# Patient Record
Sex: Female | Born: 1970
Health system: Southern US, Community
[De-identification: ages and names within clinical notes are randomized; demographics above are authoritative.]

## PROBLEM LIST (undated history)

## (undated) DIAGNOSIS — E559 Vitamin D deficiency, unspecified: Secondary | ICD-10-CM

## (undated) DIAGNOSIS — R112 Nausea with vomiting, unspecified: Secondary | ICD-10-CM

## (undated) DIAGNOSIS — R0602 Shortness of breath: Secondary | ICD-10-CM

## (undated) DIAGNOSIS — R7303 Prediabetes: Secondary | ICD-10-CM

## (undated) DIAGNOSIS — Z9889 Other specified postprocedural states: Secondary | ICD-10-CM

## (undated) DIAGNOSIS — I10 Essential (primary) hypertension: Secondary | ICD-10-CM

## (undated) HISTORY — DX: Prediabetes: R73.03

## (undated) HISTORY — DX: Vitamin D deficiency, unspecified: E55.9

## (undated) HISTORY — DX: Shortness of breath: R06.02

---

## 1998-05-02 ENCOUNTER — Ambulatory Visit (HOSPITAL_COMMUNITY): Admission: RE | Admit: 1998-05-02 | Discharge: 1998-05-02 | Payer: Self-pay | Admitting: Family Medicine

## 2001-04-28 ENCOUNTER — Ambulatory Visit (HOSPITAL_COMMUNITY): Admission: RE | Admit: 2001-04-28 | Discharge: 2001-04-28 | Payer: Self-pay | Admitting: Family Medicine

## 2001-04-28 ENCOUNTER — Encounter: Payer: Self-pay | Admitting: Family Medicine

## 2002-05-03 ENCOUNTER — Encounter: Payer: Self-pay | Admitting: Obstetrics and Gynecology

## 2002-05-03 ENCOUNTER — Ambulatory Visit (HOSPITAL_COMMUNITY): Admission: RE | Admit: 2002-05-03 | Discharge: 2002-05-03 | Payer: Self-pay | Admitting: Obstetrics and Gynecology

## 2002-08-04 ENCOUNTER — Inpatient Hospital Stay (HOSPITAL_COMMUNITY): Admission: AD | Admit: 2002-08-04 | Discharge: 2002-08-04 | Payer: Self-pay | Admitting: Obstetrics and Gynecology

## 2002-08-13 ENCOUNTER — Inpatient Hospital Stay (HOSPITAL_COMMUNITY): Admission: AD | Admit: 2002-08-13 | Discharge: 2002-08-13 | Payer: Self-pay | Admitting: Obstetrics and Gynecology

## 2002-09-03 ENCOUNTER — Inpatient Hospital Stay (HOSPITAL_COMMUNITY): Admission: AD | Admit: 2002-09-03 | Discharge: 2002-09-03 | Payer: Self-pay | Admitting: Obstetrics and Gynecology

## 2002-09-27 ENCOUNTER — Inpatient Hospital Stay (HOSPITAL_COMMUNITY): Admission: AD | Admit: 2002-09-27 | Discharge: 2002-09-29 | Payer: Self-pay | Admitting: Obstetrics and Gynecology

## 2002-11-08 ENCOUNTER — Other Ambulatory Visit: Admission: RE | Admit: 2002-11-08 | Discharge: 2002-11-08 | Payer: Self-pay | Admitting: Obstetrics and Gynecology

## 2003-12-21 ENCOUNTER — Other Ambulatory Visit: Admission: RE | Admit: 2003-12-21 | Discharge: 2003-12-21 | Payer: Self-pay | Admitting: Obstetrics and Gynecology

## 2005-01-21 ENCOUNTER — Other Ambulatory Visit: Admission: RE | Admit: 2005-01-21 | Discharge: 2005-01-21 | Payer: Self-pay | Admitting: Obstetrics and Gynecology

## 2006-06-11 ENCOUNTER — Other Ambulatory Visit: Admission: RE | Admit: 2006-06-11 | Discharge: 2006-06-11 | Payer: Self-pay | Admitting: Obstetrics and Gynecology

## 2006-07-28 ENCOUNTER — Encounter: Admission: RE | Admit: 2006-07-28 | Discharge: 2006-07-28 | Payer: Self-pay | Admitting: Obstetrics and Gynecology

## 2008-09-30 HISTORY — PX: BREAST SURGERY: SHX581

## 2009-01-16 ENCOUNTER — Other Ambulatory Visit: Admission: RE | Admit: 2009-01-16 | Discharge: 2009-01-16 | Payer: Self-pay | Admitting: Family Medicine

## 2009-09-12 ENCOUNTER — Ambulatory Visit (HOSPITAL_BASED_OUTPATIENT_CLINIC_OR_DEPARTMENT_OTHER): Admission: RE | Admit: 2009-09-12 | Discharge: 2009-09-12 | Payer: Self-pay | Admitting: Plastic Surgery

## 2010-09-30 HISTORY — PX: BREAST SURGERY: SHX581

## 2010-11-15 ENCOUNTER — Other Ambulatory Visit: Payer: Self-pay | Admitting: Dermatology

## 2011-01-01 LAB — POCT HEMOGLOBIN-HEMACUE: Hemoglobin: 13.1 g/dL (ref 12.0–15.0)

## 2011-02-15 NOTE — Discharge Summary (Signed)
Melinda Hines, Melinda Hines                        ACCOUNT NO.:  [id number]   MEDICAL RECORD NO.:  [id number]                   PATIENT TYPE:  INP   LOCATION:  9138                                 FACILITY:  [facility]   PHYSICIAN:  Huel Cote, M.D.              DATE OF BIRTH:  1970/11/19   DATE OF ADMISSION:  09/27/2002  DATE OF DISCHARGE:  09/29/2002                                 DISCHARGE SUMMARY   DISCHARGE DIAGNOSES:  1. Term pregnancy at 39 weeks, delivered.  2. Preterm contractions with this pregnancy.  3. Status post normal spontaneous vaginal delivery.   DISCHARGE MEDICATIONS:  1. Motrin 600 mg p.o. q.6h.  2. Percocet one to two tablets p.o. q.4h. p.r.n.   DISCHARGE FOLLOWUP:  The patient is to follow up in six weeks for her  routine postpartum examination.   HOSPITAL COURSE:  The patient is a 40 year old G3, P0-0-2-0 who is admitted  at [redacted] weeks gestation for induction; however, came in complaining of regular  contractions prior to that appointment.  She had some possible leakage of  fluid on admission and her cervix was 2 cm dilated with contractions every  five to seven minutes.   PRENATAL LABORATORIES:  A+.  Antibody negative.  RPR nonreactive.  Rubella  immune.  Hepatitis B surface antigen negative.  HIV negative.  GC negative.  Chlamydia negative.  Triple screen normal.  Glucola 90.  GBS negative.  Prenatal care had been complicated by preterm contractions at 32 weeks with  a positive fetal fibronectin.   PAST OB HISTORY:  Elective abortion x2.   PAST MEDICAL HISTORY:  None.   PAST SURGICAL HISTORY:  None.   ALLERGIES:  She had no known drug allergies.   HOSPITAL COURSE:  On admission she was afebrile with stable vital signs.  Fetal heart rate is reassuring with contractions every three to five  minutes.  She was gravid, nontender with an estimated fetal weight of 7.5  pounds.  The patient progressed after receiving an epidural to complete  dilation and  rested briefly before beginning pushing giving decreased  sensation.  The patient pushed approximately one hour with a normal  spontaneous vaginal delivery of a viable female infant over a first degree  laceration.  Apgars were 5, 8, and 9.  Weight was 7 pounds 2 ounces.  Baby  initially was responding to suction and became floppy after the cord was  cut.  Pediatricians were called to evaluate.  The baby responded well to  oxygen, suctioning, and stimulus.  The baby did have a mild temperature  shortly after delivery which was observed closely.  First degree laceration  was repaired with 2-0 Vicryl and the baby  was felt stable to go to the normal nursery after delivery.  On postpartum  day number two the patient and baby were doing very well.  There were no  significant complaints.  Fundus was firm.  She was afebrile with stable  vital signs and therefore was felt stable for discharge home.                                               Huel Cote, M.D.    KR/MEDQ  D:  11/23/2002  T:  11/23/2002  Job:  [id number]

## 2011-03-20 ENCOUNTER — Other Ambulatory Visit: Payer: Self-pay | Admitting: Obstetrics and Gynecology

## 2011-03-20 DIAGNOSIS — Z1231 Encounter for screening mammogram for malignant neoplasm of breast: Secondary | ICD-10-CM

## 2011-04-01 ENCOUNTER — Ambulatory Visit
Admission: RE | Admit: 2011-04-01 | Discharge: 2011-04-01 | Disposition: A | Discharge status: Home / Self Care | Payer: Self-pay | Source: Ambulatory Visit | Attending: Obstetrics and Gynecology | Admitting: Obstetrics and Gynecology

## 2011-04-01 DIAGNOSIS — Z1231 Encounter for screening mammogram for malignant neoplasm of breast: Secondary | ICD-10-CM

## 2011-04-08 ENCOUNTER — Other Ambulatory Visit: Payer: Self-pay | Admitting: Obstetrics and Gynecology

## 2011-04-08 DIAGNOSIS — R928 Other abnormal and inconclusive findings on diagnostic imaging of breast: Secondary | ICD-10-CM

## 2011-04-10 ENCOUNTER — Ambulatory Visit
Admission: RE | Admit: 2011-04-10 | Discharge: 2011-04-10 | Disposition: A | Discharge status: Home / Self Care | Payer: Self-pay | Source: Ambulatory Visit | Attending: Obstetrics and Gynecology | Admitting: Obstetrics and Gynecology

## 2011-04-10 DIAGNOSIS — R928 Other abnormal and inconclusive findings on diagnostic imaging of breast: Secondary | ICD-10-CM

## 2011-04-26 ENCOUNTER — Other Ambulatory Visit: Payer: Self-pay | Admitting: Obstetrics and Gynecology

## 2011-04-26 DIAGNOSIS — Z1231 Encounter for screening mammogram for malignant neoplasm of breast: Secondary | ICD-10-CM

## 2012-04-10 ENCOUNTER — Ambulatory Visit
Admission: RE | Admit: 2012-04-10 | Discharge: 2012-04-10 | Disposition: A | Discharge status: Home / Self Care | Payer: PRIVATE HEALTH INSURANCE | Source: Ambulatory Visit | Attending: Obstetrics and Gynecology | Admitting: Obstetrics and Gynecology

## 2012-04-10 DIAGNOSIS — Z1231 Encounter for screening mammogram for malignant neoplasm of breast: Secondary | ICD-10-CM

## 2013-01-20 ENCOUNTER — Other Ambulatory Visit: Payer: Self-pay | Admitting: Dermatology

## 2013-03-01 ENCOUNTER — Other Ambulatory Visit: Payer: Self-pay

## 2013-03-01 DIAGNOSIS — Z1231 Encounter for screening mammogram for malignant neoplasm of breast: Secondary | ICD-10-CM

## 2013-04-12 ENCOUNTER — Ambulatory Visit
Admission: RE | Admit: 2013-04-12 | Discharge: 2013-04-12 | Disposition: A | Discharge status: Home / Self Care | Payer: PRIVATE HEALTH INSURANCE | Source: Ambulatory Visit

## 2013-04-12 DIAGNOSIS — Z1231 Encounter for screening mammogram for malignant neoplasm of breast: Secondary | ICD-10-CM

## 2013-04-29 ENCOUNTER — Other Ambulatory Visit: Payer: Self-pay | Admitting: Dermatology

## 2014-03-09 ENCOUNTER — Other Ambulatory Visit: Payer: Self-pay

## 2014-03-09 DIAGNOSIS — Z1231 Encounter for screening mammogram for malignant neoplasm of breast: Secondary | ICD-10-CM

## 2014-04-11 ENCOUNTER — Other Ambulatory Visit: Payer: Self-pay | Admitting: Family Medicine

## 2014-04-11 ENCOUNTER — Ambulatory Visit
Admission: RE | Admit: 2014-04-11 | Discharge: 2014-04-11 | Disposition: A | Discharge status: Home / Self Care | Payer: 59 | Source: Ambulatory Visit | Attending: Family Medicine | Admitting: Family Medicine

## 2014-04-11 DIAGNOSIS — M79644 Pain in right finger(s): Secondary | ICD-10-CM

## 2014-04-11 DIAGNOSIS — M7989 Other specified soft tissue disorders: Secondary | ICD-10-CM

## 2014-04-13 ENCOUNTER — Ambulatory Visit
Admission: RE | Admit: 2014-04-13 | Discharge: 2014-04-13 | Disposition: A | Discharge status: Home / Self Care | Payer: 59 | Source: Ambulatory Visit

## 2014-04-13 DIAGNOSIS — Z1231 Encounter for screening mammogram for malignant neoplasm of breast: Secondary | ICD-10-CM

## 2014-06-13 ENCOUNTER — Emergency Department (HOSPITAL_COMMUNITY)
Admission: EM | Admit: 2014-06-13 | Discharge: 2014-06-13 | Disposition: A | Discharge status: Home / Self Care | Payer: 59 | Source: Home / Self Care | Attending: Emergency Medicine | Admitting: Emergency Medicine

## 2014-06-13 ENCOUNTER — Emergency Department (INDEPENDENT_AMBULATORY_CARE_PROVIDER_SITE_OTHER): Payer: 59

## 2014-06-13 ENCOUNTER — Encounter (HOSPITAL_COMMUNITY): Payer: Self-pay | Admitting: Emergency Medicine

## 2014-06-13 DIAGNOSIS — M7040 Prepatellar bursitis, unspecified knee: Secondary | ICD-10-CM

## 2014-06-13 DIAGNOSIS — W010XXA Fall on same level from slipping, tripping and stumbling without subsequent striking against object, initial encounter: Secondary | ICD-10-CM

## 2014-06-13 DIAGNOSIS — S8010XA Contusion of unspecified lower leg, initial encounter: Secondary | ICD-10-CM

## 2014-06-13 DIAGNOSIS — S8012XA Contusion of left lower leg, initial encounter: Secondary | ICD-10-CM

## 2014-06-13 DIAGNOSIS — M7042 Prepatellar bursitis, left knee: Secondary | ICD-10-CM

## 2014-06-13 NOTE — ED Notes (Signed)
1 week ago pt tripped and fell on her left knee.  She has iced it, but the pain persists.  Resolving  bruising  noted over patella

## 2014-06-13 NOTE — Discharge Instructions (Signed)
Hematoma A hematoma is a collection of blood under the skin, in an organ, in a body space, in a joint space, or in other tissue. The blood can clot to form a lump that you can see and feel. The lump is often firm and may sometimes become sore and tender. Most hematomas get better in a few days to weeks. However, some hematomas may be serious and require medical care. Hematomas can range in size from very small to very large. CAUSES  A hematoma can be caused by a blunt or penetrating injury. It can also be caused by spontaneous leakage from a blood vessel under the skin. Spontaneous leakage from a blood vessel is more likely to occur in older people, especially those taking blood thinners. Sometimes, a hematoma can develop after certain medical procedures. SIGNS AND SYMPTOMS   A firm lump on the body.  Possible pain and tenderness in the area.  Bruising.Blue, dark blue, purple-red, or yellowish skin may appear at the site of the hematoma if the hematoma is close to the surface of the skin. For hematomas in deeper tissues or body spaces, the signs and symptoms may be subtle. For example, an intra-abdominal hematoma may cause abdominal pain, weakness, fainting, and shortness of breath. An intracranial hematoma may cause a headache or symptoms such as weakness, trouble speaking, or a change in consciousness. DIAGNOSIS  A hematoma can usually be diagnosed based on your medical history and a physical exam. Imaging tests may be needed if your health care provider suspects a hematoma in deeper tissues or body spaces, such as the abdomen, head, or chest. These tests may include ultrasonography or a CT scan.  TREATMENT  Hematomas usually go away on their own over time. Rarely does the blood need to be drained out of the body. Large hematomas or those that may affect vital organs will sometimes need surgical drainage or monitoring. HOME CARE INSTRUCTIONS   Apply ice to the injured area:   Put ice in a  plastic bag.   Place a towel between your skin and the bag.   Leave the ice on for 20 minutes, 2-3 times a day for the first 1 to 2 days.   After the first 2 days, switch to using warm compresses on the hematoma.   Elevate the injured area to help decrease pain and swelling. Wrapping the area with an elastic bandage may also be helpful. Compression helps to reduce swelling and promotes shrinking of the hematoma. Make sure the bandage is not wrapped too tight.   If your hematoma is on a lower extremity and is painful, crutches may be helpful for a couple days.   Only take over-the-counter or prescription medicines as directed by your health care provider. SEEK IMMEDIATE MEDICAL CARE IF:   You have increasing pain, or your pain is not controlled with medicine.   You have a fever.   You have worsening swelling or discoloration.   Your skin over the hematoma breaks or starts bleeding.   Your hematoma is in your chest or abdomen and you have weakness, shortness of breath, or a change in consciousness.  Your hematoma is on your scalp (caused by a fall or injury) and you have a worsening headache or a change in alertness or consciousness. MAKE SURE YOU:   Understand these instructions.  Will watch your condition.  Will get help right away if you are not doing well or get worse. Document Released: 04/30/2004 Document Revised: 05/19/2013 Document Reviewed:  02/24/2013 [employer] Patient Information 2015 [place], [place]. This information is not intended to replace advice given to you by your health care provider. Make sure you discuss any questions you have with your health care provider.    Prepatellar Bursitis with Rehab  Bursitis is a condition that is characterized by inflammation of a bursa. Saunders Revel exists in many areas of the body. They are fluid-filled sacs that lie between a soft tissue (skin, tendon, or ligament) and a bone, and they reduce friction between the structures  as well as the stress placed on the soft tissue. Prepatellar bursitis is inflammation of the bursa that lies between the skin and the kneecap (patella). This condition often causes pain over the patella. SYMPTOMS   Pain, tenderness, and/or inflammation over the patella.  Pain that worsens with movement of the knee joint.  Decreased range of motion for the knee joint.  A crackling sound (crepitation) when the bursa is moved or touched.  Occasionally, painless swelling of the bursa.  Fever (when infected). CAUSES  Bursitis is caused by damage to the bursa, which results in an inflammatory response. Common mechanisms of injury include:  Direct trauma to the front of the knee.  Repetitive and/or stressful use of the knee. RISK INCREASES WITH:  Activities in which kneeling and/or falling on one's knees is likely (volleyball or football).  Repetitive and stressful training, especially if it involves running on hills.  Improper training techniques, such as a sudden increase in the intensity, frequency, or duration of training.  Failure to warm up properly before activity.  Poor technique.  Artificial turf. PREVENTION   Avoid kneeling or falling on your knees.  Warm up and stretch properly before activity.  Allow for adequate recovery between workouts.  Maintain physical fitness:  Strength, flexibility, and endurance.  Cardiovascular fitness.  Learn and use proper technique. When possible, have a coach correct improper technique.  Wear properly fitted and padded protective equipment (kneepads). PROGNOSIS  If treated properly, then the symptoms of prepatellar bursitis usually resolve within 2 weeks. RELATED COMPLICATIONS   Recurrent symptoms that result in a chronic problem.  Prolonged healing time, if improperly treated or reinjured.  Limited range of motion.  Infection of bursa.  Chronic inflammation or scarring of bursa. TREATMENT  Treatment initially  involves the use of ice and medication to help reduce pain and inflammation. The use of strengthening and stretching exercises may help reduce pain with activity, especially those of the quadriceps and hamstring muscles. These exercises may be performed at home or with referral to a therapist. Your caregiver may recommend kneepads when you return to playing sports, in order to reduce the stress on the prepatellar bursa. If symptoms persist despite treatment, then your caregiver may drain fluid out with a needle (aspirate) the bursa. If symptoms persist for greater than 6 months despite nonsurgical (conservative) treatment, then surgery may be recommended to remove the bursa.  MEDICATION  If pain medication is necessary, then nonsteroidal anti-inflammatory medications, such as aspirin and ibuprofen, or other minor pain relievers, such as acetaminophen, are often recommended.  Do not take pain medication for 7 days before surgery.  Prescription pain relievers may be given if deemed necessary by your caregiver. Use only as directed and only as much as you need.  Corticosteroid injections may be given by your caregiver. These injections should be reserved for the most serious cases, because they may only be given a certain number of times. HEAT AND COLD  Cold treatment (icing) relieves  pain and reduces inflammation. Cold treatment should be applied for 10 to 15 minutes every 2 to 3 hours for inflammation and pain and immediately after any activity that aggravates your symptoms. Use ice packs or massage the area with a piece of ice (ice massage).  Heat treatment may be used prior to performing the stretching and strengthening activities prescribed by your caregiver, physical therapist, or athletic trainer. Use a heat pack or soak the injury in warm water. SEEK MEDICAL CARE IF:  Treatment seems to offer no benefit, or the condition worsens.  Any medications produce adverse side  effects. EXERCISES RANGE OF MOTION (ROM) AND STRETCHING EXERCISES - Prepatellar Bursitis These exercises may help you when beginning to rehabilitate your injury. Your symptoms may resolve with or without further involvement from your physician, physical therapist or athletic trainer. While completing these exercises, remember:   Restoring tissue flexibility helps normal motion to return to the joints. This allows healthier, less painful movement and activity.  An effective stretch should be held for at least 30 seconds.  A stretch should never be painful. You should only feel a gentle lengthening or release in the stretched tissue. STRETCH - Hamstrings, Standing  Stand or sit and extend your right / left leg, placing your foot on a chair or foot stool  Keeping a slight arch in your low back and your hips straight forward.  Lead with your chest and lean forward at the waist until you feel a gentle stretch in the back of your right / left knee or thigh. (When done correctly, this exercise requires leaning only a small distance.)  Hold this position for __________ seconds. Repeat __________ times. Complete this stretch __________ times per day. STRETCH - Quadriceps, Prone   Lie on your stomach on a firm surface, such as a bed or padded floor.  Bend your right / left knee and grasp your ankle. If you are unable to reach, your ankle or pant leg, use a belt around your foot to lengthen your reach.  Gently pull your heel toward your buttocks. Your knee should not slide out to the side. You should feel a stretch in the front of your thigh and/or knee.  Hold this position for __________ seconds. Repeat __________ times. Complete this stretch __________ times per day.  STRETCH - Hamstrings/Adductors, V-Sit   Sit on the floor with your legs extended in a large "V," keeping your knees straight.  With your head and chest upright, bend at your waist reaching for your right foot to stretch your  left adductors.  You should feel a stretch in your left inner thigh. Hold for __________ seconds.  Return to the upright position to relax your leg muscles.  Continuing to keep your chest upright, bend straight forward at your waist to stretch your hamstrings.  You should feel a stretch behind both of your thighs and/or knees. Hold for __________ seconds.  Return to the upright position to relax your leg muscles.  Repeat steps 2 through 4. Repeat __________ times. Complete this exercise __________ times per day.  STRENGTHENING EXERCISES - Prepatellar Bursitis  These exercises may help you when beginning to rehabilitate your injury. They may resolve your symptoms with or without further involvement from your physician, physical therapist or athletic trainer. While completing these exercises, remember:  Muscles can gain both the endurance and the strength needed for everyday activities through controlled exercises.  Complete these exercises as instructed by your physician, physical therapist or athletic trainer. Progress the  resistance and repetitions only as guided. STRENGTH - Quadriceps, Isometrics  Lie on your back with your right / left leg extended and your opposite knee bent.  Gradually tense the muscles in the front of your right / left thigh. You should see either your kneecap slide up toward your hip or increased dimpling just above the knee. This motion will push the back of the knee down toward the floor/mat/bed on which you are lying.  Hold the muscle as tight as you can without increasing your pain for __________ seconds.  Relax the muscles slowly and completely in between each repetition. Repeat __________ times. Complete this exercise __________ times per day.  STRENGTH - Quadriceps, Short Arcs   Lie on your back. Place a __________ inch towel roll under your knee so that the knee slightly bends.  Raise only your lower leg by tightening the muscles in the front of your  thigh. Do not allow your thigh to rise.  Hold this position for __________ seconds. Repeat __________ times. Complete this exercise __________ times per day.  OPTIONAL ANKLE WEIGHTS: Begin with ____________________, but DO NOT exceed ____________________. Increase in1 lb/0.5 kg increments.  STRENGTH - Quadriceps, Straight Leg Raises  Quality counts! Watch for signs that the quadriceps muscle is working to insure you are strengthening the correct muscles and not "cheating" by substituting with healthier muscles.  Lay on your back with your right / left leg extended and your opposite knee bent.  Tense the muscles in the front of your right / left thigh. You should see either your kneecap slide up or increased dimpling just above the knee. Your thigh may even quiver.  Tighten these muscles even more and raise your leg 4 to 6 inches off the floor. Hold for __________ seconds.  Keeping these muscles tense, lower your leg.  Relax the muscles slowly and completely in between each repetition. Repeat __________ times. Complete this exercise __________ times per day.  STRENGTH - Quadriceps, Step-Ups   Use a thick book, step or step stool that is __________ inches tall.  Holding a wall or counter for balance only, not support.  Slowly step-up with your right / left foot, keeping your knee in line with your hip and foot. Do not allow your knee to bend so far that you cannot see your toes.  Slowly unlock your knee and lower yourself to the starting position. Your muscles, not gravity, should lower you. Repeat __________ times. Complete this exercise __________ times per day. Document Released: 09/16/2005 Document Revised: 01/31/2014 Document Reviewed: 12/29/2008 [facility] Patient Information 2015 [place], [place]. This information is not intended to replace advice given to you by your health care provider. Make sure you discuss any questions you have with your health care provider.

## 2014-06-13 NOTE — ED Provider Notes (Signed)
Medical screening examination/treatment/procedure(s) were performed by non-physician practitioner and as supervising physician I was immediately available for consultation/collaboration.  Philipp Deputy, M.D.  Harden Mo, MD 06/13/14 [phone]

## 2014-06-13 NOTE — ED Provider Notes (Signed)
CSN: [id number]     Arrival date & time 06/13/14  0900 History   First MD Initiated Contact with Patient 06/13/14 [phone]     Chief Complaint  Patient presents with  . Knee Pain   (Consider location/radiation/quality/duration/timing/severity/associated sxs/prior Treatment) HPI    43 year old female presents for evaluation of left knee pain. One week ago she tripped and fell from a height of one stair directly down onto her left knee onto a hard tile floor, all her weight landed on her left kneecap. She had pain and swelling almost immediately which has not resolved. She still has bruising overlying the patella. It is extremely tender. She is able to walk without difficulty. No numbness distal to hearing. No other injury.  History reviewed. No pertinent past medical history. History reviewed. No pertinent past surgical history. No family history on file. History  Substance Use Topics  . Smoking status: Current Every Day Smoker    Types: Cigarettes  . Smokeless tobacco: Not on file  . Alcohol Use: Yes     Comment: rare   OB History   Grav Para Term Preterm Abortions TAB SAB Ect Mult Living                 Review of Systems  Musculoskeletal:       See HPI  All other systems reviewed and are negative.   Allergies  Review of patient's allergies indicates no known allergies.  Home Medications   Prior to Admission medications   Not on File   BP 128/78  Pulse 78  Temp(Src) 98.1 F (36.7 C) (Oral)  Resp 16  SpO2 100%  LMP 06/08/2014 Physical Exam  Nursing note and vitals reviewed. Constitutional: She is oriented to person, place, and time. Vital signs are normal. She appears well-developed and well-nourished. No distress.  HENT:  Head: Normocephalic and atraumatic.  Cardiovascular:  Pulses:      Dorsalis pedis pulses are 2+ on the left side.  Pulmonary/Chest: Effort normal. No respiratory distress.  Musculoskeletal:       Left knee: She exhibits swelling, ecchymosis  (swelling and ecchymosis over the patella on the left with palpable crepitus) and bony tenderness. She exhibits normal range of motion, no deformity, normal alignment, no LCL laxity, normal patellar mobility, normal meniscus and no MCL laxity. No medial joint line, no lateral joint line, no MCL, no LCL and no patellar tendon tenderness noted.  Neurological: She is alert and oriented to person, place, and time. She has normal strength. Coordination normal.  Skin: Skin is warm and dry. No rash noted. She is not diaphoretic.  Psychiatric: She has a normal mood and affect. Judgment normal.    ED Course  Procedures (including critical care time) Labs Review Labs Reviewed - No data to display  Imaging Review Dg Knee 4 Views W/patella Left  06/13/2014   CLINICAL DATA:  Status post fall 1 week ago with knee bruising and pain.  EXAM: LEFT KNEE - COMPLETE 4+ VIEW  COMPARISON:  None.  FINDINGS: There is no evidence of fracture, dislocation, or joint effusion. There is no evidence of arthropathy or other focal bone abnormality. Soft tissues are unremarkable.  IMPRESSION: No acute abnormality.   Electronically Signed   By: Abelardo Diesel M.D.   On: 06/13/2014 09:56     MDM   1. Hematoma of leg, left, initial encounter   2. Prepatellar bursitis, left    X-rays are Normal. Continue ice as needed. Advised her that should resolve within  2-3 weeks.       Liam Graham, PA-C 06/13/14 1001

## 2014-11-28 ENCOUNTER — Encounter (HOSPITAL_COMMUNITY): Payer: Self-pay

## 2014-11-28 ENCOUNTER — Emergency Department (HOSPITAL_COMMUNITY): Payer: PRIVATE HEALTH INSURANCE

## 2014-11-28 ENCOUNTER — Emergency Department (HOSPITAL_COMMUNITY)
Admission: EM | Admit: 2014-11-28 | Discharge: 2014-11-28 | Disposition: A | Discharge status: Home / Self Care | Payer: PRIVATE HEALTH INSURANCE | Attending: Emergency Medicine | Admitting: Emergency Medicine

## 2014-11-28 DIAGNOSIS — Z3202 Encounter for pregnancy test, result negative: Secondary | ICD-10-CM | POA: Diagnosis not present

## 2014-11-28 DIAGNOSIS — Z720 Tobacco use: Secondary | ICD-10-CM | POA: Insufficient documentation

## 2014-11-28 DIAGNOSIS — R6883 Chills (without fever): Secondary | ICD-10-CM | POA: Diagnosis not present

## 2014-11-28 DIAGNOSIS — R110 Nausea: Secondary | ICD-10-CM | POA: Diagnosis not present

## 2014-11-28 DIAGNOSIS — N61 Inflammatory disorders of breast: Secondary | ICD-10-CM | POA: Diagnosis not present

## 2014-11-28 DIAGNOSIS — R0981 Nasal congestion: Secondary | ICD-10-CM | POA: Diagnosis not present

## 2014-11-28 DIAGNOSIS — R079 Chest pain, unspecified: Secondary | ICD-10-CM | POA: Diagnosis present

## 2014-11-28 DIAGNOSIS — R51 Headache: Secondary | ICD-10-CM | POA: Diagnosis not present

## 2014-11-28 LAB — CBC
HEMATOCRIT: 36.6 % (ref 36.0–46.0)
HEMOGLOBIN: 11.9 g/dL — AB (ref 12.0–15.0)
MCH: 27.9 pg (ref 26.0–34.0)
MCHC: 32.5 g/dL (ref 30.0–36.0)
MCV: 85.7 fL (ref 78.0–100.0)
Platelets: 212 10*3/uL (ref 150–400)
RBC: 4.27 MIL/uL (ref 3.87–5.11)
RDW: 13.7 % (ref 11.5–15.5)
WBC: 16 10*3/uL — AB (ref 4.0–10.5)

## 2014-11-28 LAB — BASIC METABOLIC PANEL
Anion gap: 9 (ref 5–15)
BUN: 10 mg/dL (ref 6–23)
CALCIUM: 9 mg/dL (ref 8.4–10.5)
CO2: 20 mmol/L (ref 19–32)
Chloride: 105 mmol/L (ref 96–112)
Creatinine, Ser: 0.73 mg/dL (ref 0.50–1.10)
Glucose, Bld: 103 mg/dL — ABNORMAL HIGH (ref 70–99)
POTASSIUM: 4 mmol/L (ref 3.5–5.1)
SODIUM: 134 mmol/L — AB (ref 135–145)

## 2014-11-28 LAB — I-STAT TROPONIN, ED: TROPONIN I, POC: 0 ng/mL (ref 0.00–0.08)

## 2014-11-28 LAB — POC URINE PREG, ED: PREG TEST UR: NEGATIVE

## 2014-11-28 MED ORDER — CLINDAMYCIN PHOSPHATE 600 MG/50ML IV SOLN
600.0000 mg | Freq: Once | INTRAVENOUS | Status: AC
  Administered 2014-11-28: 600 mg via INTRAVENOUS
  Filled 2014-11-28: qty 50

## 2014-11-28 MED ORDER — SODIUM CHLORIDE 0.9 % IV BOLUS (SEPSIS)
1000.0000 mL | Freq: Once | INTRAVENOUS | Status: AC
  Administered 2014-11-28: 1000 mL via INTRAVENOUS

## 2014-11-28 MED ORDER — KETOROLAC TROMETHAMINE 30 MG/ML IJ SOLN
30.0000 mg | Freq: Once | INTRAMUSCULAR | Status: AC
  Administered 2014-11-28: 30 mg via INTRAVENOUS
  Filled 2014-11-28: qty 1

## 2014-11-28 MED ORDER — CLINDAMYCIN HCL 300 MG PO CAPS: 300.0000 mg | ORAL_CAPSULE | Freq: Three times a day (TID) | ORAL | Status: DC

## 2014-11-28 MED ORDER — CLINDAMYCIN HCL 300 MG PO CAPS: 300.0000 mg | ORAL_CAPSULE | Freq: Once | ORAL | Status: DC

## 2014-11-28 MED ORDER — HYDROCODONE-ACETAMINOPHEN 5-325 MG PO TABS: 1.0000 | ORAL_TABLET | ORAL | Status: DC | PRN

## 2014-11-28 MED ORDER — ONDANSETRON HCL 4 MG/2ML IJ SOLN
4.0000 mg | Freq: Once | INTRAMUSCULAR | Status: AC
  Administered 2014-11-28: 4 mg via INTRAVENOUS
  Filled 2014-11-28: qty 2

## 2014-11-28 MED ORDER — HYDROCODONE-ACETAMINOPHEN 5-325 MG PO TABS
1.0000 | ORAL_TABLET | Freq: Once | ORAL | Status: AC
  Administered 2014-11-28: 1 via ORAL
  Filled 2014-11-28: qty 1

## 2014-11-28 NOTE — Discharge Instructions (Signed)

## 2014-11-28 NOTE — ED Provider Notes (Signed)
CSN: [id number]     Arrival date & time 11/28/14  1333 History   First MD Initiated Contact with Patient 11/28/14 1517     Chief Complaint  Patient presents with  . Chest Pain     (Consider location/radiation/quality/duration/timing/severity/associated sxs/prior Treatment) HPI Comments: 44 yo no significant PMhx presents with CC of chest pain.  Pt states this morning she had sudden onset rash to left breast, pain of this area, with associated chills, headache, body ache, nasal congestion, nausea.  Denies fever, SOB, cough, abdominal pain, vomiting, diarrhea, dysuria, or any other symptoms.  Pt denies previous occurrence.  Daughter with recent illness, with some similar features.  Denies any other concerns at this time.   The history is provided by the patient. No language interpreter was used.    History reviewed. No pertinent past medical history. History reviewed. No pertinent past surgical history. History reviewed. No pertinent family history. History  Substance Use Topics  . Smoking status: Current Every Day Smoker    Types: Cigarettes  . Smokeless tobacco: Not on file  . Alcohol Use: Yes     Comment: rare   OB History    No data available     Review of Systems  Constitutional: Positive for chills. Negative for fever.  Respiratory: Negative for cough and shortness of breath.   Cardiovascular: Positive for chest pain. Negative for palpitations and leg swelling.  Gastrointestinal: Positive for nausea. Negative for vomiting, abdominal pain and diarrhea.  Genitourinary: Negative for dysuria.  Musculoskeletal: Negative for myalgias.  Skin: Negative for rash.  Neurological: Positive for headaches. Negative for dizziness, weakness, light-headedness and numbness.  All other systems reviewed and are negative.     Allergies  Review of patient's allergies indicates no known allergies.  Home Medications   Prior to Admission medications   Not on File   BP 120/77 mmHg   Pulse 115  Temp(Src) 98.8 F (37.1 C) (Oral)  Resp 18  Ht 5\' 2"  (1.575 m)  Wt 200 lb (90.719 kg)  BMI 36.57 kg/m2  SpO2 97%  LMP 10/28/2014 Physical Exam  Constitutional: She is oriented to person, place, and time. She appears well-developed and well-nourished.  HENT:  Head: Normocephalic and atraumatic.  Right Ear: External ear normal.  Left Ear: External ear normal.  Mouth/Throat: Oropharynx is clear and moist.  Eyes: Conjunctivae and EOM are normal. Pupils are equal, round, and reactive to light.  Neck: Normal range of motion. Neck supple.  Cardiovascular: Normal rate, regular rhythm, normal heart sounds and intact distal pulses.   Pulmonary/Chest: Effort normal. No respiratory distress. She has no wheezes. She has no rales. She exhibits no tenderness.  Abdominal: Soft. Bowel sounds are normal. She exhibits no distension and no mass. There is no tenderness. There is no rebound and no guarding.  Musculoskeletal: Normal range of motion.  Neurological: She is alert and oriented to person, place, and time.  Skin: Skin is warm and dry.  Tender erythema under left breast, blanchable, no vesicles or any other associated lesions.  No fluctuance.   Nursing note and vitals reviewed.   ED Course  Procedures (including critical care time) Labs Review Labs Reviewed  CBC - Abnormal; Notable for the following:    WBC 16.0 (*)    Hemoglobin 11.9 (*)    All other components within normal limits  BASIC METABOLIC PANEL  Randolm Idol, ED    Imaging Review Dg Chest 2 View  11/28/2014   CLINICAL DATA:  Chest pain and  difficulty breathing for 1 day  EXAM: CHEST  2 VIEW  COMPARISON:  None.  FINDINGS: The lungs are clear. Heart size and pulmonary vascularity are normal. No pneumothorax. No adenopathy. No bone lesions.  IMPRESSION: No edema or consolidation.   Electronically Signed   By: Lowella Grip III M.D.   On: 11/28/2014 14:59     EKG Interpretation   Date/Time:  Monday  November 28 2014 13:35:50 EST Ventricular Rate:  114 PR Interval:  136 QRS Duration: 78 QT Interval:  342 QTC Calculation: 471 R Axis:   99 Text Interpretation:  Sinus tachycardia Rightward axis Cannot rule out  Anterior infarct , age undetermined Abnormal ECG No previous ECGs  available Confirmed by Christy Gentles  MD, DONALD ([id number]) on 11/28/2014 3:15:51  PM      MDM   Final diagnoses:  Mastitis, left, acute   44 yo no significant PMhx presents with CC of chest pain.    Physical exam as above.  Pt afebrile, slightly tachycardic, normotensive, satting well on RA.  On physical exam there appears to be area of mastitis of left breast.  Pt not nursing, and there is no open skin breaks, unsure etiology.     Labs demonstrate leukocytosis to 16, normal Troponin, negative pregnancy test, and BMP is WNL. CXR WNL.  EKG sinus tachycardia, with poor R wave progression.   Pt's CP likely 2/2 left chest cellulitis.  Pt given NS bolus X 2, with some improvement in tachycardia; Clindamycin IV; and Norco for pain with some improvement.   Okay for d/c at this time.  Rx Clindamycin, and Norco.  F/u with PCP in 1 week.  Return precautions given.  Pt understands and agrees with plan.   Sinda Du  Discussed pt with attending Dr. Christy Gentles.     Sinda Du, MD 11/29/14 0104  Sharyon Cable, MD 11/30/14 [phone]

## 2014-11-28 NOTE — ED Provider Notes (Signed)
Patient seen/examined in the Emergency Department in conjunction with Resident Physician Provider Justin Mend Patient reports left breast pain Exam : with chaperone present, she has erythema/tenderness to left breast Plan: will tx for cellulitis/mastitis.    Sharyon Cable, MD 11/28/14 [phone]

## 2014-11-28 NOTE — ED Notes (Signed)
Pt reports chest pain, chills, headache, arm pain, head and neck pain. Started this AM>

## 2015-03-07 ENCOUNTER — Other Ambulatory Visit: Payer: Self-pay

## 2015-03-07 DIAGNOSIS — Z1231 Encounter for screening mammogram for malignant neoplasm of breast: Secondary | ICD-10-CM

## 2015-04-17 ENCOUNTER — Other Ambulatory Visit: Payer: Self-pay

## 2015-04-17 DIAGNOSIS — Z9889 Other specified postprocedural states: Secondary | ICD-10-CM

## 2015-04-17 DIAGNOSIS — Z1231 Encounter for screening mammogram for malignant neoplasm of breast: Secondary | ICD-10-CM

## 2015-04-18 ENCOUNTER — Ambulatory Visit
Admission: RE | Admit: 2015-04-18 | Discharge: 2015-04-18 | Disposition: A | Discharge status: Home / Self Care | Payer: No Typology Code available for payment source | Source: Ambulatory Visit

## 2015-04-18 DIAGNOSIS — Z9889 Other specified postprocedural states: Secondary | ICD-10-CM

## 2015-04-18 DIAGNOSIS — Z1231 Encounter for screening mammogram for malignant neoplasm of breast: Secondary | ICD-10-CM

## 2017-03-15 ENCOUNTER — Emergency Department (HOSPITAL_COMMUNITY)
Admission: EM | Admit: 2017-03-15 | Discharge: 2017-03-15 | Disposition: A | Discharge status: Home / Self Care | Payer: No Typology Code available for payment source | Attending: Emergency Medicine | Admitting: Emergency Medicine

## 2017-03-15 ENCOUNTER — Encounter (HOSPITAL_COMMUNITY): Payer: Self-pay

## 2017-03-15 DIAGNOSIS — G43909 Migraine, unspecified, not intractable, without status migrainosus: Secondary | ICD-10-CM | POA: Diagnosis not present

## 2017-03-15 DIAGNOSIS — F17210 Nicotine dependence, cigarettes, uncomplicated: Secondary | ICD-10-CM | POA: Diagnosis not present

## 2017-03-15 DIAGNOSIS — R51 Headache: Secondary | ICD-10-CM | POA: Diagnosis present

## 2017-03-15 DIAGNOSIS — G43009 Migraine without aura, not intractable, without status migrainosus: Secondary | ICD-10-CM

## 2017-03-15 LAB — CBC WITH DIFFERENTIAL/PLATELET
Basophils Absolute: 0 10*3/uL (ref 0.0–0.1)
Basophils Relative: 0 %
EOS PCT: 3 %
Eosinophils Absolute: 0.2 10*3/uL (ref 0.0–0.7)
HCT: 37 % (ref 36.0–46.0)
Hemoglobin: 12 g/dL (ref 12.0–15.0)
Lymphocytes Relative: 28 %
Lymphs Abs: 1.4 10*3/uL (ref 0.7–4.0)
MCH: 28.6 pg (ref 26.0–34.0)
MCHC: 32.4 g/dL (ref 30.0–36.0)
MCV: 88.3 fL (ref 78.0–100.0)
Monocytes Absolute: 0.6 10*3/uL (ref 0.1–1.0)
Monocytes Relative: 12 %
NEUTROS ABS: 2.9 10*3/uL (ref 1.7–7.7)
NEUTROS PCT: 57 %
Platelets: 229 10*3/uL (ref 150–400)
RBC: 4.19 MIL/uL (ref 3.87–5.11)
RDW: 13.9 % (ref 11.5–15.5)
WBC: 5.1 10*3/uL (ref 4.0–10.5)

## 2017-03-15 LAB — BASIC METABOLIC PANEL
Anion gap: 8 (ref 5–15)
BUN: 12 mg/dL (ref 6–20)
CO2: 24 mmol/L (ref 22–32)
Calcium: 9 mg/dL (ref 8.9–10.3)
Chloride: 106 mmol/L (ref 101–111)
Creatinine, Ser: 0.8 mg/dL (ref 0.44–1.00)
GFR calc Af Amer: >60 mL/min (ref >60)
GFR calc non Af Amer: >60 mL/min (ref >60)
GLUCOSE: 101 mg/dL — AB (ref 65–99)
Potassium: 4.1 mmol/L (ref 3.5–5.1)
Sodium: 138 mmol/L (ref 135–145)

## 2017-03-15 LAB — I-STAT BETA HCG BLOOD, ED (MC, WL, AP ONLY)

## 2017-03-15 MED ORDER — KETOROLAC TROMETHAMINE 15 MG/ML IJ SOLN
15.0000 mg | Freq: Once | INTRAMUSCULAR | Status: AC
  Administered 2017-03-15: 15 mg via INTRAVENOUS
  Filled 2017-03-15: qty 1

## 2017-03-15 MED ORDER — DIPHENHYDRAMINE HCL 50 MG/ML IJ SOLN
25.0000 mg | Freq: Once | INTRAMUSCULAR | Status: AC
  Administered 2017-03-15: 25 mg via INTRAVENOUS
  Filled 2017-03-15: qty 1

## 2017-03-15 MED ORDER — SODIUM CHLORIDE 0.9 % IV BOLUS (SEPSIS)
1000.0000 mL | Freq: Once | INTRAVENOUS | Status: AC
  Administered 2017-03-15: 1000 mL via INTRAVENOUS

## 2017-03-15 MED ORDER — ONDANSETRON HCL 4 MG PO TABS: 4.0000 mg | ORAL_TABLET | Freq: Three times a day (TID) | ORAL | 0 refills | Status: AC | PRN

## 2017-03-15 MED ORDER — METOCLOPRAMIDE HCL 5 MG/ML IJ SOLN
10.0000 mg | Freq: Once | INTRAMUSCULAR | Status: AC
Start: 2017-03-15 — End: 2017-03-15
  Administered 2017-03-15: 10 mg via INTRAVENOUS
  Filled 2017-03-15: qty 2

## 2017-03-15 NOTE — ED Provider Notes (Signed)
[facility] DEPT Provider Note   CSN: [id number] Arrival date & time: 03/15/17  1712     History   Chief Complaint Chief Complaint  Patient presents with  . Headache    HPI Melinda Hines is a 46 y.o. female.  HPI  Patient is a 46 year old female no significant past medical history who presents to the emergency department for a 2 day history of frontal headache. States a progressively worsening headache since yesterday, frontal, dull, nonradiating. Bright lights and noise makes it worse. Nothing has improved the headache. Has not taken anything for the headache. Associated with nausea but no vomiting. Denies recent falls or head trauma. Denies change in vision, slurring of speech, dizziness, difficulty swallowing, numbness or weakness of extremities. Denies fever or chills.  History reviewed. No pertinent past medical history.  There are no active problems to display for this patient.   History reviewed. No pertinent surgical history.  OB History    No data available       Home Medications    Prior to Admission medications   Medication Sig Start Date End Date Taking? Authorizing Provider  clindamycin (CLEOCIN) 300 MG capsule Take 1 capsule (300 mg total) by mouth 3 (three) times daily. 11/28/14   Sinda Du, MD  HYDROcodone-acetaminophen (NORCO/VICODIN) 5-325 MG per tablet Take 1 tablet by mouth every 4 (four) hours as needed. 11/28/14   Sinda Du, MD  ondansetron (ZOFRAN) 4 MG tablet Take 1 tablet (4 mg total) by mouth every 8 (eight) hours as needed for nausea or vomiting. 03/15/17 03/17/17  Nathaniel Man, MD    Family History History reviewed. No pertinent family history.  Social History Social History  Substance Use Topics  . Smoking status: Current Every Day Smoker    Types: Cigarettes  . Smokeless tobacco: Never Used  . Alcohol use Yes     Comment: rare     Allergies   Patient has no known allergies.   Review of Systems Review of Systems    Constitutional: Negative for appetite change and fever.  HENT: Negative for congestion.   Eyes: Positive for photophobia. Negative for visual disturbance.  Respiratory: Negative for cough, chest tightness and shortness of breath.   Cardiovascular: Negative for chest pain.  Gastrointestinal: Positive for nausea. Negative for abdominal pain, diarrhea and vomiting.  Genitourinary: Negative for dysuria and hematuria.  Musculoskeletal: Negative for back pain and neck stiffness.  Skin: Negative for rash.  Neurological: Positive for headaches. Negative for dizziness, seizures, speech difficulty, weakness and light-headedness.  Psychiatric/Behavioral: Negative for behavioral problems.     Physical Exam Updated Vital Signs BP (!) 148/90   Pulse 81   Temp 98.5 F (36.9 C) (Oral)   Resp 16   LMP 02/28/2017 (Approximate)   SpO2 99%   Physical Exam  Constitutional: She is oriented to person, place, and time. She appears well-developed and well-nourished. She appears distressed.  Sitting in the room with a washcloth over her eyes.  HENT:  Head: Atraumatic.  Mouth/Throat: Oropharynx is clear and moist.  Eyes: Conjunctivae and EOM are normal.  Photophobia  Neck: Normal range of motion.  Cardiovascular: Normal rate, regular rhythm, normal heart sounds and intact distal pulses.   Pulmonary/Chest: Effort normal and breath sounds normal.  Abdominal: There is no tenderness.  Musculoskeletal: Normal range of motion.  Neurological: She is alert and oriented to person, place, and time. She has normal strength and normal reflexes. No cranial nerve deficit or sensory deficit. GCS eye subscore is 4.  GCS verbal subscore is 5. GCS motor subscore is 6.  Skin: Skin is warm. No pallor.  Psychiatric: She has a normal mood and affect.     ED Treatments / Results  Labs (all labs ordered are listed, but only abnormal results are displayed) Labs Reviewed  BASIC METABOLIC PANEL - Abnormal; Notable for the  following:       Result Value   Glucose, Bld 101 (*)    All other components within normal limits  CBC WITH DIFFERENTIAL/PLATELET  I-STAT BETA HCG BLOOD, ED ([facility], [facility], [facility] ONLY)    EKG  EKG Interpretation None       Radiology No results found.  Procedures Procedures (including critical care time)  Medications Ordered in ED Medications  sodium chloride 0.9 % bolus 1,000 mL (1,000 mLs Intravenous New Bag/Given 03/15/17 2027)  metoCLOPramide (REGLAN) injection 10 mg (10 mg Intravenous Given 03/15/17 2024)  diphenhydrAMINE (BENADRYL) injection 25 mg (25 mg Intravenous Given 03/15/17 2025)  ketorolac (TORADOL) 15 MG/ML injection 15 mg (15 mg Intravenous Given 03/15/17 2025)     Initial Impression / Assessment and Plan / ED Course  I have reviewed the triage vital signs and the nursing notes.  Pertinent labs & imaging results that were available during my care of the patient were reviewed by me and considered in my medical decision making (see chart for details).    46 year old female in no significant past medical history who presents with a 2 day history of headache. On arrival in obvious distress, not ill appearing. Hemodynamically stable. Normal neurologic exam.  No significant electrolyte abnormalities. Patient most likely with a primary headache, described as a migraine headache. No prior history of migraines. Not sudden onset, doubt SAH. No chronic headaches, doubt intracranial hemorrhage or mass. No anticoagulation. Doubt meningitis, no fever altered mental status. Given headache medication in the emergency department with significant improvement of her headache.  Stable for discharge home. Patient will take NSAIDs for her headache. Discussed follow-up with her primary care physician. Given strict return precautions to the emergency department. Expressed understanding, no questions or concerns at time of discharge.  Final Clinical Impressions(s) / ED Diagnoses   Final  diagnoses:  Migraine without aura and without status migrainosus, not intractable    New Prescriptions New Prescriptions   ONDANSETRON (ZOFRAN) 4 MG TABLET    Take 1 tablet (4 mg total) by mouth every 8 (eight) hours as needed for nausea or vomiting.     Nathaniel Man, MD 03/15/17 2107    Lajean Saver, MD 03/16/17 [phone]

## 2017-03-15 NOTE — ED Triage Notes (Signed)
Pt complaining of headache and nausea x 2 days. Pt states no hx of migraines. Pt a/o x 4 at triage, ambulatory.

## 2018-11-27 DIAGNOSIS — F17200 Nicotine dependence, unspecified, uncomplicated: Secondary | ICD-10-CM | POA: Diagnosis not present

## 2018-11-27 DIAGNOSIS — R591 Generalized enlarged lymph nodes: Secondary | ICD-10-CM | POA: Diagnosis not present

## 2018-11-27 DIAGNOSIS — J3089 Other allergic rhinitis: Secondary | ICD-10-CM | POA: Diagnosis not present

## 2019-02-09 DIAGNOSIS — N3090 Cystitis, unspecified without hematuria: Secondary | ICD-10-CM | POA: Diagnosis not present

## 2019-02-09 DIAGNOSIS — F17200 Nicotine dependence, unspecified, uncomplicated: Secondary | ICD-10-CM | POA: Diagnosis not present

## 2019-03-02 DIAGNOSIS — L02212 Cutaneous abscess of back [any part, except buttock]: Secondary | ICD-10-CM | POA: Diagnosis not present

## 2019-03-03 DIAGNOSIS — S20469A Insect bite (nonvenomous) of unspecified back wall of thorax, initial encounter: Secondary | ICD-10-CM | POA: Diagnosis not present

## 2019-03-03 DIAGNOSIS — R109 Unspecified abdominal pain: Secondary | ICD-10-CM | POA: Diagnosis not present

## 2019-03-03 DIAGNOSIS — W57XXXA Bitten or stung by nonvenomous insect and other nonvenomous arthropods, initial encounter: Secondary | ICD-10-CM | POA: Diagnosis not present

## 2019-03-29 DIAGNOSIS — Z85828 Personal history of other malignant neoplasm of skin: Secondary | ICD-10-CM | POA: Diagnosis not present

## 2019-03-29 DIAGNOSIS — L814 Other melanin hyperpigmentation: Secondary | ICD-10-CM | POA: Diagnosis not present

## 2019-03-29 DIAGNOSIS — D225 Melanocytic nevi of trunk: Secondary | ICD-10-CM | POA: Diagnosis not present

## 2019-03-29 DIAGNOSIS — L821 Other seborrheic keratosis: Secondary | ICD-10-CM | POA: Diagnosis not present

## 2019-04-06 DIAGNOSIS — Z1231 Encounter for screening mammogram for malignant neoplasm of breast: Secondary | ICD-10-CM | POA: Diagnosis not present

## 2019-04-06 DIAGNOSIS — Z1151 Encounter for screening for human papillomavirus (HPV): Secondary | ICD-10-CM | POA: Diagnosis not present

## 2019-04-06 DIAGNOSIS — Z6836 Body mass index (BMI) 36.0-36.9, adult: Secondary | ICD-10-CM | POA: Diagnosis not present

## 2019-04-06 DIAGNOSIS — Z130 Encounter for screening for diseases of the blood and blood-forming organs and certain disorders involving the immune mechanism: Secondary | ICD-10-CM | POA: Diagnosis not present

## 2019-04-06 DIAGNOSIS — Z124 Encounter for screening for malignant neoplasm of cervix: Secondary | ICD-10-CM | POA: Diagnosis not present

## 2019-04-06 DIAGNOSIS — Z0000 Encounter for general adult medical examination without abnormal findings: Secondary | ICD-10-CM | POA: Diagnosis not present

## 2019-04-06 DIAGNOSIS — Z01419 Encounter for gynecological examination (general) (routine) without abnormal findings: Secondary | ICD-10-CM | POA: Diagnosis not present

## 2019-11-22 ENCOUNTER — Ambulatory Visit
Admission: RE | Admit: 2019-11-22 | Discharge: 2019-11-22 | Disposition: A | Discharge status: Home / Self Care | Payer: BC Managed Care – PPO | Source: Ambulatory Visit | Attending: Family Medicine | Admitting: Family Medicine

## 2019-11-22 ENCOUNTER — Other Ambulatory Visit: Payer: Self-pay | Admitting: Family Medicine

## 2019-11-22 DIAGNOSIS — Z0000 Encounter for general adult medical examination without abnormal findings: Secondary | ICD-10-CM | POA: Diagnosis not present

## 2019-11-22 DIAGNOSIS — M533 Sacrococcygeal disorders, not elsewhere classified: Secondary | ICD-10-CM

## 2019-11-22 DIAGNOSIS — E559 Vitamin D deficiency, unspecified: Secondary | ICD-10-CM | POA: Diagnosis not present

## 2019-11-22 DIAGNOSIS — M25551 Pain in right hip: Secondary | ICD-10-CM | POA: Diagnosis not present

## 2019-11-22 DIAGNOSIS — Z13220 Encounter for screening for lipoid disorders: Secondary | ICD-10-CM | POA: Diagnosis not present

## 2019-11-25 DIAGNOSIS — Z0000 Encounter for general adult medical examination without abnormal findings: Secondary | ICD-10-CM | POA: Diagnosis not present

## 2019-11-29 DIAGNOSIS — R1900 Intra-abdominal and pelvic swelling, mass and lump, unspecified site: Secondary | ICD-10-CM | POA: Diagnosis not present

## 2019-11-29 DIAGNOSIS — D259 Leiomyoma of uterus, unspecified: Secondary | ICD-10-CM | POA: Diagnosis not present

## 2019-12-06 DIAGNOSIS — D259 Leiomyoma of uterus, unspecified: Secondary | ICD-10-CM | POA: Diagnosis not present

## 2019-12-06 DIAGNOSIS — R1900 Intra-abdominal and pelvic swelling, mass and lump, unspecified site: Secondary | ICD-10-CM | POA: Diagnosis not present

## 2019-12-06 DIAGNOSIS — N83201 Unspecified ovarian cyst, right side: Secondary | ICD-10-CM | POA: Diagnosis not present

## 2020-02-14 NOTE — Patient Instructions (Addendum)
Get your Covid test at [facility] in [place]. On 02/18/20 at 2:40 PM   Melinda Hines       Your procedure is scheduled on 02/22/20   Report to [facility]  at  5:30 A.M.    Call this number if you have problems the morning of surgery:[phone]    OUR ADDRESS IS [address], WE ARE [facility]ATED [facility] WITH [facility].   The day before surgery have a:  Full Liquid Diet   Strained creamy soups Tea, Coffee- with cream or mild and sugar or honey  Juices- cranberry , grape and apple  Jello  Milkshakes  Pudding , custards  Popsicles  Water Plain ice cream f, frozen yogurt, sherbet, plain yogurt  Fruit ices and popsicles with no fruit pulp  Sugar, honey and syrups Clear broths  Boost, Ensure, Resource and other liquid supplements NO CARBONATED BEVERAGES     Remember:  Do not eat food after midnight  But you may have clear liquids until 4:30 AM       Take these medicines the morning of surgery with A SIP OF WATER:none   Do not wear jewelry, make-up or nail polish.  Do not wear lotions, powders, or perfumes, or deoderant.  Do not shave 48 hours prior to surgery.    Do not bring valuables to the hospital.  [facility] is not responsible for any belongings or valuables.  Contacts, dentures or bridgework may not be worn into surgery.    For patients admitted to the hospital, discharge time will be determined by your treatment team.  Patients discharged the day of surgery will not be allowed to drive home.   Special instructions:  Bring prescription meds in their original bottles  Please read over the following fact sheets that you were given:       [facility] - Preparing for Surgery  Before surgery, you can play an important role.   Because skin is not sterile, your skin needs to be as free of germs as possible.   You can reduce the number of germs on your skin by washing with CHG (chlorahexidine gluconate)  soap before surgery.   CHG is an antiseptic cleaner which kills germs and bonds with the skin to continue killing germs even after washing. Please DO NOT use if you have an allergy to CHG or antibacterial soaps.   If your skin becomes reddened/irritated stop using the CHG and inform your nurse when you arrive at Short Stay. Do not shave (including legs and underarms) for at least 48 hours prior to the first CHG shower.    Please follow these instructions carefully:  1.  Shower with CHG Soap the night before surgery and the  morning of Surgery.  2.  If you choose to wash your hair, wash your hair first as usual with your  normal  shampoo.  3.  After you shampoo, rinse your hair and body thoroughly to remove the  shampoo.                                        4.  Use CHG as you would any other liquid soap.  You can apply chg directly  to the skin and wash  Gently with a scrungie or clean washcloth.  5.  Apply the CHG Soap to your body ONLY FROM THE NECK DOWN.   Do not use on face/ open                           Wound or open sores. Avoid contact with eyes, ears mouth and genitals (private parts).                       Wash face,  Genitals (private parts) with your normal soap.             6.  Wash thoroughly, paying special attention to the area where your surgery  will be performed.  7.  Thoroughly rinse your body with warm water from the neck down.  8.  DO NOT shower/wash with your normal soap after using and rinsing off  the CHG Soap.             9.  Pat yourself dry with a clean towel.            10.  Wear clean pajamas.            11.  Place clean sheets on your bed the night of your first shower and do not  sleep with pets. Day of Surgery : Do not apply any lotions/deodorants the morning of surgery.  Please wear clean clothes to the hospital/surgery center.  FAILURE TO FOLLOW THESE INSTRUCTIONS MAY RESULT IN THE CANCELLATION OF YOUR SURGERY PATIENT  SIGNATURE_________________________________  NURSE SIGNATURE__________________________________  ________________________________________________________________________   Melinda Hines  An incentive spirometer is a tool that can help keep your lungs clear and active. This tool measures how well you are filling your lungs with each breath. Taking long deep breaths may help reverse or decrease the chance of developing breathing (pulmonary) problems (especially infection) following:  A long period of time when you are unable to move or be active. BEFORE THE PROCEDURE   If the spirometer includes an indicator to show your best effort, your nurse or respiratory therapist will set it to a desired goal.  If possible, sit up straight or lean slightly forward. Try not to slouch.  Hold the incentive spirometer in an upright position. INSTRUCTIONS FOR USE  1. Sit on the edge of your bed if possible, or sit up as far as you can in bed or on a chair. 2. Hold the incentive spirometer in an upright position. 3. Breathe out normally. 4. Place the mouthpiece in your mouth and seal your lips tightly around it. 5. Breathe in slowly and as deeply as possible, raising the piston or the ball toward the top of the column. 6. Hold your breath for 3-5 seconds or for as long as possible. Allow the piston or ball to fall to the bottom of the column. 7. Remove the mouthpiece from your mouth and breathe out normally. 8. Rest for a few seconds and repeat Steps 1 through 7 at least 10 times every 1-2 hours when you are awake. Take your time and take a few normal breaths between deep breaths. 9. The spirometer may include an indicator to show your best effort. Use the indicator as a goal to work toward during each repetition. 10. After each set of 10 deep breaths, practice coughing to be sure your lungs are clear. If you have an incision (the cut made at the time of surgery), support your incision  when coughing  by placing a pillow or rolled up towels firmly against it. Once you are able to get out of bed, walk around indoors and cough well. You may stop using the incentive spirometer when instructed by your caregiver.  RISKS AND COMPLICATIONS  Take your time so you do not get dizzy or light-headed.  If you are in pain, you may need to take or ask for pain medication before doing incentive spirometry. It is harder to take a deep breath if you are having pain. AFTER USE  Rest and breathe slowly and easily.  It can be helpful to keep track of a log of your progress. Your caregiver can provide you with a simple table to help with this. If you are using the spirometer at home, follow these instructions: [place] IF:   You are having difficultly using the spirometer.  You have trouble using the spirometer as often as instructed.  Your pain medication is not giving enough relief while using the spirometer.  You develop fever of 100.5 F (38.1 C) or higher. SEEK IMMEDIATE MEDICAL CARE IF:   You cough up bloody sputum that had not been present before.  You develop fever of 102 F (38.9 C) or greater.  You develop worsening pain at or near the incision site. MAKE SURE YOU:   Understand these instructions.  Will watch your condition.  Will get help right away if you are not doing well or get worse. Document Released: 01/27/2007 Document Revised: 12/09/2011 Document Reviewed: 03/30/2007 [facility] Patient Information 2014 [place], [place].   ________________________________________________________________________

## 2020-02-15 ENCOUNTER — Other Ambulatory Visit: Payer: Self-pay

## 2020-02-15 ENCOUNTER — Encounter (HOSPITAL_COMMUNITY)
Admission: RE | Admit: 2020-02-15 | Discharge: 2020-02-15 | Disposition: A | Discharge status: Home / Self Care | Payer: BC Managed Care – PPO | Source: Ambulatory Visit | Attending: Obstetrics and Gynecology | Admitting: Obstetrics and Gynecology

## 2020-02-15 ENCOUNTER — Encounter (HOSPITAL_COMMUNITY): Payer: Self-pay

## 2020-02-15 DIAGNOSIS — R9431 Abnormal electrocardiogram [ECG] [EKG]: Secondary | ICD-10-CM | POA: Insufficient documentation

## 2020-02-15 DIAGNOSIS — Z01818 Encounter for other preprocedural examination: Secondary | ICD-10-CM | POA: Diagnosis not present

## 2020-02-15 DIAGNOSIS — I10 Essential (primary) hypertension: Secondary | ICD-10-CM | POA: Insufficient documentation

## 2020-02-15 HISTORY — DX: Nausea with vomiting, unspecified: Z98.890

## 2020-02-15 HISTORY — DX: Nausea with vomiting, unspecified: R11.2

## 2020-02-15 HISTORY — DX: Essential (primary) hypertension: I10

## 2020-02-15 LAB — BASIC METABOLIC PANEL
Anion gap: 10 (ref 5–15)
BUN: 15 mg/dL (ref 6–20)
CO2: 26 mmol/L (ref 22–32)
Calcium: 9 mg/dL (ref 8.9–10.3)
Chloride: 102 mmol/L (ref 98–111)
Creatinine, Ser: 0.75 mg/dL (ref 0.44–1.00)
GFR calc Af Amer: >60 mL/min (ref >60)
GFR calc non Af Amer: >60 mL/min (ref >60)
Glucose, Bld: 105 mg/dL — ABNORMAL HIGH (ref 70–99)
Potassium: 3.6 mmol/L (ref 3.5–5.1)
Sodium: 138 mmol/L (ref 135–145)

## 2020-02-15 LAB — CBC
HCT: 42.1 % (ref 36.0–46.0)
Hemoglobin: 13.2 g/dL (ref 12.0–15.0)
MCH: 28.6 pg (ref 26.0–34.0)
MCHC: 31.4 g/dL (ref 30.0–36.0)
MCV: 91.1 fL (ref 80.0–100.0)
Platelets: 246 10*3/uL (ref 150–400)
RBC: 4.62 MIL/uL (ref 3.87–5.11)
RDW: 14.2 % (ref 11.5–15.5)
WBC: 5.2 10*3/uL (ref 4.0–10.5)
nRBC: 0 % (ref 0.0–0.2)

## 2020-02-15 LAB — ABO/RH: ABO/RH(D): O POS

## 2020-02-15 NOTE — Progress Notes (Signed)
PCP - Dr. Deland Pretty Cardiologist - no  Chest x-ray - no EKG -  Stress Test - no ECHO - no Cardiac Cath - no  Sleep Study - no CPAP -   Fasting Blood Sugar - NA Checks Blood Sugar _____ times a day  Blood Thinner Instructions:NA Aspirin Instructions: Last Dose:  Anesthesia review:   Patient denies shortness of breath, fever, cough and chest pain at PAT appointment yes  Patient verbalized understanding of instructions that were given to them at the PAT appointment. Patient was also instructed that they will need to review over the PAT instructions again at home before surgery. yes

## 2020-02-18 ENCOUNTER — Other Ambulatory Visit (HOSPITAL_COMMUNITY)
Admission: RE | Admit: 2020-02-18 | Discharge: 2020-02-18 | Disposition: A | Discharge status: Home / Self Care | Payer: BC Managed Care – PPO | Source: Ambulatory Visit | Attending: Obstetrics and Gynecology | Admitting: Obstetrics and Gynecology

## 2020-02-18 DIAGNOSIS — Z01812 Encounter for preprocedural laboratory examination: Secondary | ICD-10-CM | POA: Insufficient documentation

## 2020-02-18 DIAGNOSIS — Z20822 Contact with and (suspected) exposure to covid-19: Secondary | ICD-10-CM | POA: Diagnosis not present

## 2020-02-18 LAB — SARS CORONAVIRUS 2 (TAT 6-24 HRS): SARS Coronavirus 2: NEGATIVE

## 2020-02-21 NOTE — H&P (Signed)
Melinda Hines is an 49 y.o. female G3P1 who comes in for scheduled TLH/BS/RSO possible laparotomy/cystoscopy for a pelvic mass and menorrhagia. Pt began to have hip pain last Fall on the right and was imaged in March with 8X5X6cm cystic mass noted of right ovary as well as multiple small fibroids. Since that time she has continued to have some pain on the right especially if bending or lifting. She takes NSAID's prescribed by PMD and that controls the pain. She has monthly menses that can be quite heavy. She had a negative ROMA screen and needed a particular time for surgery, thus the delay.  Pertinent Gynecological History:  OB History: NSVD x 1   Menstrual History:  No LMP recorded.    Past Medical History:  Diagnosis Date  . Hypertension   . PONV (postoperative nausea and vomiting)     Past Surgical History:  Procedure Laterality Date  . BREAST SURGERY Bilateral 2012   reduction    No family history on file.  Social History:  reports that she has been smoking cigarettes. She has a 3.75 pack-year smoking history. She has never used smokeless tobacco. She reports current alcohol use. She reports that she does not use drugs.  Allergies: No Known Allergies  No medications prior to admission.    Review of Systems  Constitutional: Negative for fever.  Gastrointestinal: Positive for abdominal pain (intermittent). Negative for nausea.    There were no vitals taken for this visit. Physical Exam  Constitutional: She appears well-developed.  Cardiovascular: Normal rate and regular rhythm.  Respiratory: Effort normal and breath sounds normal.  GI: Soft. There is no abdominal tenderness.  Neurological: She is alert.  Psychiatric: She has a normal mood and affect.  Vulva: no erythema, no lesions, no vesicles, no masses, no tenderness, no atrophy Vagina: no erythema, no cystocele, no tenderness, no atrophy Cervix: grossly normal, no discharge, no cervical motion  tenderness Uterus: midline, no uterine prolapse, mobile, non-tender, enlarged 12 wks, fibroids Bladder/Urethra: normal meatus Adnexa/Parametria (posterior mass felt indistinctly on right , slightly tender)   No results found for this or any previous visit (from the past 24 hour(s)).  No results found.  Assessment/Plan: Pt was counseled on the risks and benefits of TLH/BS/right oophorectomy including bleeding, infection, and possible damage to bowel and bladder or ureters.  She would accept a transfusion if needed.  We reviewed the procedure in detail including laparoscopic vs vaginal closure of cuff.  We discussed a possible incision in the event the mass cannot be resected or in event of any complication and possible delayed recovery from that. We discussed we will try to avoid spillage of the adnexal cyst but that it could still occur, or if too adherent might require open approach to resect.  She is ready to proceed.  [name] 02/21/2020, 4:31 AM

## 2020-02-21 NOTE — Anesthesia Preprocedure Evaluation (Addendum)
Anesthesia Evaluation  Patient identified by MRN, date of birth, ID band Patient awake    Reviewed: Allergy & Precautions, NPO status , Patient's Chart, lab work & pertinent test results  History of Anesthesia Complications (+) PONV  Airway Mallampati: II  TM Distance: >3 FB Neck ROM: Full    Dental  (+) Dental Advisory Given   Pulmonary Current Smoker,    breath sounds clear to auscultation       Cardiovascular hypertension, Pt. on medications  Rhythm:Regular Rate:Normal     Neuro/Psych negative neurological ROS     GI/Hepatic negative GI ROS, Neg liver ROS,   Endo/Other  negative endocrine ROS  Renal/GU negative Renal ROS     Musculoskeletal   Abdominal   Peds  Hematology negative hematology ROS (+)   Anesthesia Other Findings   Reproductive/Obstetrics                             Lab Results  Component Value Date   WBC 5.2 02/15/2020   HGB 13.2 02/15/2020   HCT 42.1 02/15/2020   MCV 91.1 02/15/2020   PLT 246 02/15/2020   Lab Results  Component Value Date   CREATININE 0.75 02/15/2020   BUN 15 02/15/2020   NA 138 02/15/2020   K 3.6 02/15/2020   CL 102 02/15/2020   CO2 26 02/15/2020    Anesthesia Physical Anesthesia Plan  ASA: II  Anesthesia Plan: General   Post-op Pain Management:    Induction: Intravenous  PONV Risk Score and Plan: 4 or greater and Scopolamine patch - Pre-op, Midazolam, Dexamethasone, Ondansetron and Treatment may vary due to age or medical condition  Airway Management Planned: Oral ETT  Additional Equipment: None  Intra-op Plan:   Post-operative Plan: Extubation in OR  Informed Consent: I have reviewed the patients History and Physical, chart, labs and discussed the procedure including the risks, benefits and alternatives for the proposed anesthesia with the patient or authorized representative who has indicated his/her understanding and  acceptance.     Dental advisory given  Plan Discussed with: CRNA  Anesthesia Plan Comments:        Anesthesia Quick Evaluation

## 2020-02-22 ENCOUNTER — Encounter (HOSPITAL_BASED_OUTPATIENT_CLINIC_OR_DEPARTMENT_OTHER)
Admission: RE | Disposition: A | Discharge status: Home / Self Care | Payer: Self-pay | Source: Home / Self Care | Attending: Obstetrics and Gynecology

## 2020-02-22 ENCOUNTER — Ambulatory Visit (HOSPITAL_BASED_OUTPATIENT_CLINIC_OR_DEPARTMENT_OTHER): Payer: BC Managed Care – PPO | Admitting: Anesthesiology

## 2020-02-22 ENCOUNTER — Other Ambulatory Visit: Payer: Self-pay

## 2020-02-22 ENCOUNTER — Ambulatory Visit (HOSPITAL_BASED_OUTPATIENT_CLINIC_OR_DEPARTMENT_OTHER)
Admission: RE | Admit: 2020-02-22 | Discharge: 2020-02-23 | Disposition: A | Discharge status: Home / Self Care | Payer: BC Managed Care – PPO | Attending: Obstetrics and Gynecology | Admitting: Obstetrics and Gynecology

## 2020-02-22 ENCOUNTER — Encounter (HOSPITAL_BASED_OUTPATIENT_CLINIC_OR_DEPARTMENT_OTHER): Payer: Self-pay | Admitting: Obstetrics and Gynecology

## 2020-02-22 DIAGNOSIS — D270 Benign neoplasm of right ovary: Secondary | ICD-10-CM | POA: Insufficient documentation

## 2020-02-22 DIAGNOSIS — D251 Intramural leiomyoma of uterus: Secondary | ICD-10-CM | POA: Diagnosis not present

## 2020-02-22 DIAGNOSIS — N920 Excessive and frequent menstruation with regular cycle: Secondary | ICD-10-CM | POA: Diagnosis not present

## 2020-02-22 DIAGNOSIS — N83201 Unspecified ovarian cyst, right side: Secondary | ICD-10-CM | POA: Diagnosis not present

## 2020-02-22 DIAGNOSIS — R1900 Intra-abdominal and pelvic swelling, mass and lump, unspecified site: Secondary | ICD-10-CM | POA: Diagnosis present

## 2020-02-22 DIAGNOSIS — Z90710 Acquired absence of both cervix and uterus: Secondary | ICD-10-CM | POA: Diagnosis present

## 2020-02-22 DIAGNOSIS — N72 Inflammatory disease of cervix uteri: Secondary | ICD-10-CM | POA: Diagnosis not present

## 2020-02-22 DIAGNOSIS — F17210 Nicotine dependence, cigarettes, uncomplicated: Secondary | ICD-10-CM | POA: Diagnosis not present

## 2020-02-22 DIAGNOSIS — D259 Leiomyoma of uterus, unspecified: Secondary | ICD-10-CM | POA: Diagnosis not present

## 2020-02-22 DIAGNOSIS — I10 Essential (primary) hypertension: Secondary | ICD-10-CM | POA: Insufficient documentation

## 2020-02-22 DIAGNOSIS — Z79899 Other long term (current) drug therapy: Secondary | ICD-10-CM | POA: Insufficient documentation

## 2020-02-22 HISTORY — PX: TOTAL LAPAROSCOPIC HYSTERECTOMY WITH SALPINGECTOMY: SHX6742

## 2020-02-22 HISTORY — PX: CYSTOSCOPY: SHX5120

## 2020-02-22 LAB — POCT PREGNANCY, URINE: Preg Test, Ur: NEGATIVE

## 2020-02-22 LAB — TYPE AND SCREEN
ABO/RH(D): O POS
Antibody Screen: NEGATIVE

## 2020-02-22 SURGERY — HYSTERECTOMY, TOTAL, LAPAROSCOPIC, WITH SALPINGECTOMY
Anesthesia: General | Site: Urethra

## 2020-02-22 MED ORDER — CELECOXIB 200 MG PO CAPS
ORAL_CAPSULE | ORAL | Status: AC
  Filled 2020-02-22: qty 1

## 2020-02-22 MED ORDER — KETAMINE HCL 10 MG/ML IJ SOLN
INTRAMUSCULAR | Status: DC | PRN
  Administered 2020-02-22: 25 mg via INTRAVENOUS

## 2020-02-22 MED ORDER — ONDANSETRON HCL 4 MG/2ML IJ SOLN
INTRAMUSCULAR | Status: AC
  Filled 2020-02-22: qty 2

## 2020-02-22 MED ORDER — SUGAMMADEX SODIUM 200 MG/2ML IV SOLN
INTRAVENOUS | Status: DC | PRN
  Administered 2020-02-22: 200 mg via INTRAVENOUS

## 2020-02-22 MED ORDER — HYDROMORPHONE HCL 1 MG/ML IJ SOLN
0.2000 mg | INTRAMUSCULAR | Status: DC | PRN
  Administered 2020-02-22: 0.5 mg via INTRAVENOUS

## 2020-02-22 MED ORDER — CEFAZOLIN SODIUM-DEXTROSE 2-4 GM/100ML-% IV SOLN
INTRAVENOUS | Status: AC
  Filled 2020-02-22: qty 100

## 2020-02-22 MED ORDER — IBUPROFEN 800 MG PO TABS
800.0000 mg | ORAL_TABLET | Freq: Three times a day (TID) | ORAL | Status: DC
  Administered 2020-02-22 – 2020-02-23 (×3): 800 mg via ORAL

## 2020-02-22 MED ORDER — FENTANYL CITRATE (PF) 100 MCG/2ML IJ SOLN
25.0000 ug | INTRAMUSCULAR | Status: DC | PRN
  Administered 2020-02-22: 50 ug via INTRAVENOUS
  Administered 2020-02-22: 25 ug via INTRAVENOUS

## 2020-02-22 MED ORDER — SIMETHICONE 80 MG PO CHEW
CHEWABLE_TABLET | ORAL | Status: AC
  Filled 2020-02-22: qty 1

## 2020-02-22 MED ORDER — FENTANYL CITRATE (PF) 100 MCG/2ML IJ SOLN
INTRAMUSCULAR | Status: AC
  Filled 2020-02-22: qty 2

## 2020-02-22 MED ORDER — TRIAMTERENE-HCTZ 37.5-25 MG PO TABS
1.0000 | ORAL_TABLET | Freq: Every morning | ORAL | Status: DC
  Filled 2020-02-22 (×2): qty 1

## 2020-02-22 MED ORDER — OXYCODONE HCL 5 MG PO TABS
ORAL_TABLET | ORAL | Status: AC
  Filled 2020-02-22: qty 1

## 2020-02-22 MED ORDER — SODIUM CHLORIDE 0.9 % IR SOLN
Status: DC | PRN
  Administered 2020-02-22: 3000 mL

## 2020-02-22 MED ORDER — SCOPOLAMINE 1 MG/3DAYS TD PT72
1.0000 | MEDICATED_PATCH | TRANSDERMAL | Status: DC
  Administered 2020-02-22: 1.5 mg via TRANSDERMAL

## 2020-02-22 MED ORDER — HYDROMORPHONE HCL 1 MG/ML IJ SOLN
INTRAMUSCULAR | Status: AC
  Filled 2020-02-22: qty 1

## 2020-02-22 MED ORDER — BUPIVACAINE HCL (PF) 0.25 % IJ SOLN
INTRAMUSCULAR | Status: DC | PRN
  Administered 2020-02-22: 20 mL

## 2020-02-22 MED ORDER — MAGNESIUM HYDROXIDE 400 MG/5ML PO SUSP
30.0000 mL | Freq: Every day | ORAL | Status: DC | PRN
  Filled 2020-02-22: qty 30

## 2020-02-22 MED ORDER — ACETAMINOPHEN 500 MG PO TABS
1000.0000 mg | ORAL_TABLET | Freq: Four times a day (QID) | ORAL | Status: DC
  Administered 2020-02-22 – 2020-02-23 (×4): 1000 mg via ORAL

## 2020-02-22 MED ORDER — MENTHOL 3 MG MT LOZG: 1.0000 | LOZENGE | OROMUCOSAL | Status: DC | PRN

## 2020-02-22 MED ORDER — LORATADINE 10 MG PO TABS
10.0000 mg | ORAL_TABLET | Freq: Every day | ORAL | Status: DC
  Filled 2020-02-22: qty 1

## 2020-02-22 MED ORDER — MIDAZOLAM HCL 2 MG/2ML IJ SOLN
INTRAMUSCULAR | Status: AC
  Filled 2020-02-22: qty 2

## 2020-02-22 MED ORDER — DEXAMETHASONE SODIUM PHOSPHATE 10 MG/ML IJ SOLN
INTRAMUSCULAR | Status: AC
  Filled 2020-02-22: qty 1

## 2020-02-22 MED ORDER — ACETAMINOPHEN 500 MG PO TABS
1000.0000 mg | ORAL_TABLET | Freq: Once | ORAL | Status: AC
  Administered 2020-02-22: 1000 mg via ORAL

## 2020-02-22 MED ORDER — ROCURONIUM BROMIDE 10 MG/ML (PF) SYRINGE
PREFILLED_SYRINGE | INTRAVENOUS | Status: AC
  Filled 2020-02-22: qty 10

## 2020-02-22 MED ORDER — SIMETHICONE 80 MG PO CHEW
80.0000 mg | CHEWABLE_TABLET | Freq: Four times a day (QID) | ORAL | Status: DC | PRN
  Administered 2020-02-22: 80 mg via ORAL

## 2020-02-22 MED ORDER — LACTATED RINGERS IV SOLN: INTRAVENOUS | Status: DC

## 2020-02-22 MED ORDER — ACETAMINOPHEN 500 MG PO TABS
ORAL_TABLET | ORAL | Status: AC
  Filled 2020-02-22: qty 2

## 2020-02-22 MED ORDER — LIDOCAINE HCL 2 % IJ SOLN
INTRAMUSCULAR | Status: AC
  Filled 2020-02-22: qty 20

## 2020-02-22 MED ORDER — INDIGOTINDISULFONATE SODIUM 8 MG/ML IJ SOLN
INTRAMUSCULAR | Status: DC | PRN
  Administered 2020-02-22: 5 mL via INTRAVENOUS

## 2020-02-22 MED ORDER — LIDOCAINE 2% (20 MG/ML) 5 ML SYRINGE
INTRAMUSCULAR | Status: AC
  Filled 2020-02-22: qty 5

## 2020-02-22 MED ORDER — IBUPROFEN 800 MG PO TABS
ORAL_TABLET | ORAL | Status: AC
  Filled 2020-02-22: qty 1

## 2020-02-22 MED ORDER — DEXAMETHASONE SODIUM PHOSPHATE 4 MG/ML IJ SOLN
INTRAMUSCULAR | Status: DC | PRN
  Administered 2020-02-22: 10 mg via INTRAVENOUS

## 2020-02-22 MED ORDER — KETAMINE HCL 10 MG/ML IJ SOLN
INTRAMUSCULAR | Status: AC
  Filled 2020-02-22: qty 1

## 2020-02-22 MED ORDER — CELECOXIB 200 MG PO CAPS
200.0000 mg | ORAL_CAPSULE | Freq: Once | ORAL | Status: AC
  Administered 2020-02-22: 200 mg via ORAL

## 2020-02-22 MED ORDER — PROMETHAZINE HCL 25 MG/ML IJ SOLN: 6.2500 mg | INTRAMUSCULAR | Status: DC | PRN

## 2020-02-22 MED ORDER — ONDANSETRON HCL 4 MG/2ML IJ SOLN
INTRAMUSCULAR | Status: DC | PRN
  Administered 2020-02-22: 4 mg via INTRAVENOUS

## 2020-02-22 MED ORDER — PROPOFOL 10 MG/ML IV BOLUS
INTRAVENOUS | Status: DC | PRN
  Administered 2020-02-22: 160 mg via INTRAVENOUS

## 2020-02-22 MED ORDER — SCOPOLAMINE 1 MG/3DAYS TD PT72
MEDICATED_PATCH | TRANSDERMAL | Status: AC
  Filled 2020-02-22: qty 1

## 2020-02-22 MED ORDER — EPHEDRINE SULFATE-NACL 50-0.9 MG/10ML-% IV SOSY
PREFILLED_SYRINGE | INTRAVENOUS | Status: DC | PRN
  Administered 2020-02-22: 10 mg via INTRAVENOUS

## 2020-02-22 MED ORDER — CEFAZOLIN SODIUM-DEXTROSE 2-4 GM/100ML-% IV SOLN
2.0000 g | INTRAVENOUS | Status: AC
  Administered 2020-02-22: 2 g via INTRAVENOUS

## 2020-02-22 MED ORDER — ONDANSETRON HCL 4 MG/2ML IJ SOLN
4.0000 mg | Freq: Four times a day (QID) | INTRAMUSCULAR | Status: DC | PRN
  Administered 2020-02-22: 4 mg via INTRAVENOUS

## 2020-02-22 MED ORDER — FENTANYL CITRATE (PF) 100 MCG/2ML IJ SOLN
INTRAMUSCULAR | Status: DC | PRN
  Administered 2020-02-22 (×2): 100 ug via INTRAVENOUS
  Administered 2020-02-22: 50 ug via INTRAVENOUS

## 2020-02-22 MED ORDER — EPHEDRINE 5 MG/ML INJ
INTRAVENOUS | Status: AC
  Filled 2020-02-22: qty 10

## 2020-02-22 MED ORDER — ROCURONIUM BROMIDE 10 MG/ML (PF) SYRINGE
PREFILLED_SYRINGE | INTRAVENOUS | Status: DC | PRN
  Administered 2020-02-22: 5 mg via INTRAVENOUS
  Administered 2020-02-22: 10 mg via INTRAVENOUS
  Administered 2020-02-22: 20 mg via INTRAVENOUS
  Administered 2020-02-22: 50 mg via INTRAVENOUS

## 2020-02-22 MED ORDER — BISACODYL 10 MG RE SUPP: 10.0000 mg | Freq: Every day | RECTAL | Status: DC | PRN

## 2020-02-22 MED ORDER — FENTANYL CITRATE (PF) 250 MCG/5ML IJ SOLN
INTRAMUSCULAR | Status: AC
  Filled 2020-02-22: qty 5

## 2020-02-22 MED ORDER — LIDOCAINE 2% (20 MG/ML) 5 ML SYRINGE
INTRAMUSCULAR | Status: DC | PRN
  Administered 2020-02-22: 60 mg via INTRAVENOUS

## 2020-02-22 MED ORDER — ONDANSETRON HCL 4 MG PO TABS: 4.0000 mg | ORAL_TABLET | Freq: Four times a day (QID) | ORAL | Status: DC | PRN

## 2020-02-22 MED ORDER — OXYCODONE HCL 5 MG PO TABS
5.0000 mg | ORAL_TABLET | ORAL | Status: DC | PRN
  Administered 2020-02-22 – 2020-02-23 (×3): 5 mg via ORAL

## 2020-02-22 MED ORDER — PROPOFOL 10 MG/ML IV BOLUS
INTRAVENOUS | Status: AC
  Filled 2020-02-22: qty 20

## 2020-02-22 MED ORDER — LIDOCAINE 20MG/ML (2%) 15 ML SYRINGE OPTIME
INTRAMUSCULAR | Status: DC | PRN
  Administered 2020-02-22: 1.5 mg/kg/h via INTRAVENOUS

## 2020-02-22 MED ORDER — MIDAZOLAM HCL 5 MG/5ML IJ SOLN
INTRAMUSCULAR | Status: DC | PRN
  Administered 2020-02-22: 2 mg via INTRAVENOUS

## 2020-02-22 SURGICAL SUPPLY — 60 items
ADH SKN CLS APL DERMABOND .7 (GAUZE/BANDAGES/DRESSINGS) ×3
BAG LAPAROSCOPIC 12 15 PORT 16 (BASKET) IMPLANT
BAG RETRIEVAL 10 (BASKET) ×1
BAG RETRIEVAL 10MM (BASKET) ×1
BAG RETRIEVAL 12/15 (BASKET) ×4
BAG RETRIEVAL 12/15MM (BASKET) ×1
COVER BACK TABLE 60X90IN (DRAPES) ×5 IMPLANT
COVER WAND RF STERILE (DRAPES) ×5 IMPLANT
DERMABOND ADVANCED (GAUZE/BANDAGES/DRESSINGS) ×2
DERMABOND ADVANCED .7 DNX12 (GAUZE/BANDAGES/DRESSINGS) ×3 IMPLANT
DEVICE SUTURE ENDOST 10MM (ENDOMECHANICALS) ×2 IMPLANT
DURAPREP 26ML APPLICATOR (WOUND CARE) ×5 IMPLANT
GAUZE 4X4 16PLY RFD (DISPOSABLE) ×5 IMPLANT
GLOVE BIO SURGEON STRL SZ 6.5 (GLOVE) ×2 IMPLANT
GLOVE BIO SURGEON STRL SZ7 (GLOVE) ×10 IMPLANT
GLOVE BIO SURGEONS STRL SZ 6.5 (GLOVE) ×2
GLOVE BIOGEL PI IND STRL 6.5 (GLOVE) IMPLANT
GLOVE BIOGEL PI IND STRL 7.0 (GLOVE) IMPLANT
GLOVE BIOGEL PI INDICATOR 6.5 (GLOVE) ×4
GLOVE BIOGEL PI INDICATOR 7.0 (GLOVE) ×6
GLOVE ECLIPSE 6.5 STRL STRAW (GLOVE) ×6 IMPLANT
GOWN STRL REUS W/TWL LRG LVL3 (GOWN DISPOSABLE) ×14 IMPLANT
HOLDER FOLEY CATH W/STRAP (MISCELLANEOUS) ×2 IMPLANT
MANIFOLD NEPTUNE II (INSTRUMENTS) ×2 IMPLANT
NDL SAFETY ECLIPSE 18X1.5 (NEEDLE) IMPLANT
NEEDLE HYPO 18GX1.5 SHARP (NEEDLE) ×5
NEEDLE INSUFFLATION 120MM (ENDOMECHANICALS) ×5 IMPLANT
NS IRRIG 1000ML POUR BTL (IV SOLUTION) ×5 IMPLANT
OCCLUDER COLPOPNEUMO (BALLOONS) ×2 IMPLANT
PACK LAPAROSCOPY BASIN (CUSTOM PROCEDURE TRAY) ×5 IMPLANT
PACK TRENDGUARD 450 HYBRID PRO (MISCELLANEOUS) ×3 IMPLANT
POUCH LAPAROSCOPIC INSTRUMENT (MISCELLANEOUS) ×5 IMPLANT
SCOPETTES 8  STERILE (MISCELLANEOUS) ×5
SCOPETTES 8 STERILE (MISCELLANEOUS) IMPLANT
SET IRRIG Y TYPE TUR BLADDER L (SET/KITS/TRAYS/PACK) ×2 IMPLANT
SET SUCTION IRRIG HYDROSURG (IRRIGATION / IRRIGATOR) ×7 IMPLANT
SET TRI-LUMEN FLTR TB AIRSEAL (TUBING) ×5 IMPLANT
SHEARS HARMONIC ACE PLUS 36CM (ENDOMECHANICALS) ×2 IMPLANT
SUT ENDO VLOC 180-0-8IN (SUTURE) ×5 IMPLANT
SUT VIC AB 0 CT1 36 (SUTURE) ×10 IMPLANT
SUT VIC AB 4-0 PS2 27 (SUTURE) ×5 IMPLANT
SUT VICRYL 0 UR6 27IN ABS (SUTURE) ×7 IMPLANT
SUT VLOC 180 0 9IN  GS21 (SUTURE) ×5
SUT VLOC 180 0 9IN GS21 (SUTURE) IMPLANT
SYR 10ML LL (SYRINGE) ×5 IMPLANT
SYR 50ML LL SCALE MARK (SYRINGE) ×5 IMPLANT
SYS BAG RETRIEVAL 10MM (BASKET) ×3
SYSTEM BAG RETRIEVAL 10MM (BASKET) IMPLANT
SYSTEM CARTER THOMASON II (TROCAR) ×5 IMPLANT
TIP UTERINE 6.7X8CM BLUE DISP (MISCELLANEOUS) ×2 IMPLANT
TOWEL OR 17X26 10 PK STRL BLUE (TOWEL DISPOSABLE) ×5 IMPLANT
TRAY FOLEY W/BAG SLVR 14FR (SET/KITS/TRAYS/PACK) ×5 IMPLANT
TRENDGUARD 450 HYBRID PRO PACK (MISCELLANEOUS) ×5
TROCAR 12M 150ML BLUNT (TROCAR) ×2 IMPLANT
TROCAR BALLN 12MMX100 BLUNT (TROCAR) ×2 IMPLANT
TROCAR BLADELESS OPT 5 100 (ENDOMECHANICALS) ×10 IMPLANT
TROCAR PORT AIRSEAL 8X100 (TROCAR) ×5 IMPLANT
TROCAR XCEL BLUNT TIP 100MML (ENDOMECHANICALS) ×2 IMPLANT
TROCAR XCEL NON-BLD 11X100MML (ENDOMECHANICALS) ×2 IMPLANT
WARMER LAPAROSCOPE (MISCELLANEOUS) ×5 IMPLANT

## 2020-02-22 NOTE — Progress Notes (Signed)
Patient ID: Melinda Hines, female   DOB: 09/13/1971, 49 y.o.   MRN: [id number] No changes in dictated H&P.  Brief exam WNL.  Ready to proceed.

## 2020-02-22 NOTE — Discharge Summary (Signed)
Physician Discharge Summary  Patient ID: Melinda Hines MRN: [id number] DOB/AGE: 03/31/1971 49 y.o.  Admit date: 02/22/2020 Discharge date: 02/23/2020  Admission Diagnoses: Right ovarian mass Fibroid uterus Menorrhagia  Discharge Diagnoses:  Active Problems:   Pelvic mass   S/P laparoscopic hysterectomy   Discharged Condition: good  Hospital Course: Pt was admitted to extended observation post-surgery and did well.  By post-operative Day #1 she was ambulating, tolerating po meds and voiding without problem.  She was d/c home with instructions and prescriptions   Significant Diagnostic Studies: labs: CBC, BMP  Treatments: surgery: TLH/BS/right oophorectomy  Discharge Exam: Blood pressure 91/60, pulse 73, temperature 98 F (36.7 C), resp. rate 18, height 5\' 2"  (1.575 m), weight 90.5 kg, SpO2 97 %. General appearance: alert and cooperative GI: soft, NT, incisons well-approximated  Disposition: Discharge disposition: 01-Home or Self Care       Discharge Instructions    Call MD for:  persistant nausea and vomiting   Complete by: As directed    Call MD for:  redness, tenderness, or signs of infection (pain, swelling, redness, odor or green/yellow discharge around incision site)   Complete by: As directed    Call MD for:  severe uncontrolled pain   Complete by: As directed    Call MD for:  temperature >100.4   Complete by: As directed    Diet - low sodium heart healthy   Complete by: As directed    Discharge instructions   Complete by: As directed    Avoid driving for at least  weeks or until off narcotic pain meds.  No heavy lifting greater than 10 lbs.  Nothing in vagina for 6 weeks.  Shower over incision and pat dry.     Allergies as of 02/23/2020   No Known Allergies     Medication List    STOP taking these medications   clindamycin 300 MG capsule Commonly known as: CLEOCIN   HYDROcodone-acetaminophen 5-325 MG tablet Commonly known as: NORCO/VICODIN    naproxen 500 MG tablet Commonly known as: NAPROSYN     TAKE these medications   acetaminophen 500 MG tablet Commonly known as: TYLENOL Take 2 tablets (1,000 mg total) by mouth every 6 (six) hours.   ibuprofen 800 MG tablet Commonly known as: ADVIL Take 1 tablet (800 mg total) by mouth every 8 (eight) hours.   loratadine 10 MG tablet Commonly known as: CLARITIN Take 10 mg by mouth daily.   oxyCODONE 5 MG immediate release tablet Commonly known as: Oxy IR/ROXICODONE Take 1-2 tablets (5-10 mg total) by mouth every 4 (four) hours as needed for moderate pain.   triamterene-hydrochlorothiazide 37.5-25 MG tablet Commonly known as: MAXZIDE-25 Take 1 tablet by mouth every morning.      Follow-up Information    Paula Compton, MD. Schedule an appointment as soon as possible for a visit in 3 week(s).   Specialty: Obstetrics and Gynecology Why: incision check Contact information: [address] [place] [address] [phone]           Signed: [name] 02/23/2020, 8:28 AM

## 2020-02-22 NOTE — Op Note (Signed)
Operative Note  Due to the size of the uterus and the right adnexal mass Dr. Ivor Costa assistance was necessary to complete the case for exposure and assistance  Preoperative Diagnosis Right ovarian cyst Fibroid uterus Menorrhagia  Postoperative Diagnosis same  Procedure Total laparoscopic hysterectomy/bilateral salpingectomies/right oophorectomy/cystoscopy  Surgeon Paula Compton, MD Carlynn Purl, DO  Anesthesia GETA  Fluids: EBL 117mL UOP 71mL clear IVF 1067mL LR  Findings The right ovary had a smooth cystic mass and measured about 8cm in diameter.  It was mobile.  The uterus was enlarged to about 12 weeks size with fibroids.  The left ovary was WNL and retained  Specimen Uterus, cervix, fallopian tubes and right ovarian mass  Procedure Note  Patient was taken to the operating room where general anesthesia was obtained without difficulty she was prepped and draped in the normal sterile fashion in the dorsal lithotomy position. An appropriate time out was performed. An 43mm Rumi with cervical cup was placed after dilation. The vaginal occluder was in place with a syringe intact to inflate later in the case.  A Foley catheter placed in the bladder. Attention was then turned to the abdomen where of infraumbilical incision was made after injection with quarter percent Marcaine approximate 2 cm in width. The fascia was then identified and grasped with Coker clamps and elevated. The fascia was then opened sharply with Mayo scissors and intraperitoneal placement of the Sheryle Hail was performed, after a pursestring suture of 0 Vicryl was placed around the fascial edge. With the [facility] in place the pneumoperitoneum was obtained and the pelvis and abdomen inspected with findings as previously stated. Two additional  ports were placed under direct visualization from the lateral upper quadrants. An 52mm airseal port was placed on the right and an 11 mm port on the left.   Each port site was  injected with quarter percent Marcaine prior placement.   With patient in Trendelenburg the uterus and tubes and ovaries were inspected with findings as previously stated.  The right ovarian mass filled the posterior cul-de-sac so it was dissected free using the Harmonic scalpel to free it from the right pelvic side wall taking down the infundibulopelvic ligament and the uteroovarian ligament, until it was completely free.  The endobag was then introduced through the Duncan and the mass placed within the bag.  It was pulled up to the umbilical incision and a needle used to decompress it by removing thick serous fluid until it collapsed and the bag was then pulled out of the incision.  The Sheryle Hail was then replaced.  The Harmonic scalpel was then utilized to dissect the left fallopian tubes from the mesosalpinx bilaterally down to the level of the cornua.  The remainder of the left uteroovarian ligament and the round ligament were then also taken down with the Harmonic to the level of the bladder flap.  The bladder flap was taken down from the lower uterine segment and pushed away to expose the cervix.  The uterine arteries were then taken down bilaterally with no difficulty and the vaginal cuff identified and opened over the vaginal ring.  The cuff was then completely opened over the ring until the uterus was completely free.  It was then pulled down into the vagina.  The vaginal cuff was then closed with a running v-lock suture with good closure.The cuff and pedicles were hemostatic and the ureters visualized and normal in appearance. A four quadrant view of the pelvis and abdomen was performed and found to be  normal with no bleeding or injuries noted.  The instruments were removed from the abdomen as well as the lateral ports under visualization.  The pneumoperitoneum was reduced through the trocar. The trocar was finally removed and the infraumbilical incision and lateral incisions were closed.  The umbilical  pursestring suture was tied down with adequate closure. The lateral ports were closed with a deep suture of 0 vicryl using the Carter-Thomasson device and with a subcuticular stitch of 3-0 Vicryl.  Dermabond and a bandage were placed. A cystoscopy was performed and no sutures noted in the bladder.  Indigo carmine was given and both ureteral jets were seen and normal. The camera was removed.  Patient was then awakened and taken to the recovery room in good condition.

## 2020-02-22 NOTE — Anesthesia Postprocedure Evaluation (Signed)
Anesthesia Post Note  Patient: Melinda Hines  Procedure(s) Performed: TOTAL LAPAROSCOPIC HYSTERECTOMY WITH BILATERAL SALPINGECTOMY, RIGHT OOPHORECTOMY (Bilateral Abdomen) CYSTOSCOPY (N/A Urethra)     Patient location during evaluation: PACU Anesthesia Type: General Level of consciousness: awake and alert Pain management: pain level controlled Vital Signs Assessment: post-procedure vital signs reviewed and stable Respiratory status: spontaneous breathing, nonlabored ventilation, respiratory function stable and patient connected to nasal cannula oxygen Cardiovascular status: blood pressure returned to baseline and stable Postop Assessment: no apparent nausea or vomiting Anesthetic complications: no    Last Vitals:  Vitals:   02/22/20 1220 02/22/20 1248  BP: 125/82 121/76  Pulse: 85 86  Resp:  17  Temp: (!) 36.4 C 36.9 C  SpO2: 97% 97%    Last Pain:  Vitals:   02/22/20 1311  PainSc: 7                  Tiajuana Amass

## 2020-02-22 NOTE — Transfer of Care (Signed)
Immediate Anesthesia Transfer of Care Note  Patient: Melinda Hines  Procedure(s) Performed: TOTAL LAPAROSCOPIC HYSTERECTOMY WITH BILATERAL SALPINGECTOMY, RIGHT OOPHORECTOMY (Bilateral Abdomen) CYSTOSCOPY (N/A Urethra)  Patient Location: PACU  Anesthesia Type:General  Level of Consciousness: awake, alert  and oriented  Airway & Oxygen Therapy: Patient Spontanous Breathing and Patient connected to nasal cannula oxygen  Post-op Assessment: Report given to RN and Post -op Vital signs reviewed and stable  Post vital signs: Reviewed and stable  Last Vitals:  Vitals Value Taken Time  BP 142/100 02/22/20 1048  Temp 36.4 C 02/22/20 1049  Pulse 82 02/22/20 1051  Resp 18 02/22/20 1051  SpO2 99 % 02/22/20 1051  Vitals shown include unvalidated device data.  Last Pain:  Vitals:   02/22/20 0606  PainSc: 0-No pain      Patients Stated Pain Goal: 5 ([id number])  Complications: No apparent anesthesia complications

## 2020-02-22 NOTE — Progress Notes (Signed)
Day of Surgery Procedure(s) (LRB): TOTAL LAPAROSCOPIC HYSTERECTOMY WITH BILATERAL SALPINGECTOMY, RIGHT OOPHORECTOMY (Bilateral) CYSTOSCOPY (N/A)  Subjective: Patient reports tolerating PO.  She has ambulated twice and her pain is well-controlled.  She still has her foley  Objective: I have reviewed patient's vital signs and intake and output.  General: alert and cooperative GI: incision: well-approximated with some mild bruising and soft, ND Vaginal Bleeding: minimal  Assessment: s/p Procedure(s): TOTAL LAPAROSCOPIC HYSTERECTOMY WITH BILATERAL SALPINGECTOMY, RIGHT OOPHORECTOMY (Bilateral) CYSTOSCOPY (N/A): progressing well  Plan: Discontinue IV fluids and d/c foley Plan d/c in AM  LOS: 0 days    [name] 02/22/2020, 6:17 PM

## 2020-02-22 NOTE — Anesthesia Procedure Notes (Signed)
Procedure Name: Intubation Date/Time: 02/22/2020 7:32 AM Performed by: Maryjean Corpening D, CRNA Pre-anesthesia Checklist: Patient identified, Emergency Drugs available, Suction available and Patient being monitored Patient Re-evaluated:Patient Re-evaluated prior to induction Oxygen Delivery Method: Circle system utilized Preoxygenation: Pre-oxygenation with 100% oxygen Induction Type: IV induction Ventilation: Mask ventilation without difficulty Laryngoscope Size: Mac and 3 Grade View: Grade I Tube type: Oral Tube size: 7.0 mm Number of attempts: 1 Airway Equipment and Method: Stylet Placement Confirmation: ETT inserted through vocal cords under direct vision,  positive ETCO2 and breath sounds checked- equal and bilateral Secured at: 22 cm Tube secured with: Tape Dental Injury: Teeth and Oropharynx as per pre-operative assessment

## 2020-02-23 DIAGNOSIS — I10 Essential (primary) hypertension: Secondary | ICD-10-CM | POA: Diagnosis not present

## 2020-02-23 DIAGNOSIS — N920 Excessive and frequent menstruation with regular cycle: Secondary | ICD-10-CM | POA: Diagnosis not present

## 2020-02-23 DIAGNOSIS — Z79899 Other long term (current) drug therapy: Secondary | ICD-10-CM | POA: Diagnosis not present

## 2020-02-23 DIAGNOSIS — D251 Intramural leiomyoma of uterus: Secondary | ICD-10-CM | POA: Diagnosis not present

## 2020-02-23 DIAGNOSIS — F17210 Nicotine dependence, cigarettes, uncomplicated: Secondary | ICD-10-CM | POA: Diagnosis not present

## 2020-02-23 DIAGNOSIS — N72 Inflammatory disease of cervix uteri: Secondary | ICD-10-CM | POA: Diagnosis not present

## 2020-02-23 DIAGNOSIS — D270 Benign neoplasm of right ovary: Secondary | ICD-10-CM | POA: Diagnosis not present

## 2020-02-23 LAB — CBC
HCT: 32 % — ABNORMAL LOW (ref 36.0–46.0)
Hemoglobin: 10.1 g/dL — ABNORMAL LOW (ref 12.0–15.0)
MCH: 29.4 pg (ref 26.0–34.0)
MCHC: 31.6 g/dL (ref 30.0–36.0)
MCV: 93 fL (ref 80.0–100.0)
Platelets: 186 10*3/uL (ref 150–400)
RBC: 3.44 MIL/uL — ABNORMAL LOW (ref 3.87–5.11)
RDW: 14.2 % (ref 11.5–15.5)
WBC: 8.5 10*3/uL (ref 4.0–10.5)
nRBC: 0 % (ref 0.0–0.2)

## 2020-02-23 LAB — BASIC METABOLIC PANEL
Anion gap: 8 (ref 5–15)
BUN: 13 mg/dL (ref 6–20)
CO2: 25 mmol/L (ref 22–32)
Calcium: 8.1 mg/dL — ABNORMAL LOW (ref 8.9–10.3)
Chloride: 105 mmol/L (ref 98–111)
Creatinine, Ser: 0.86 mg/dL (ref 0.44–1.00)
GFR calc Af Amer: >60 mL/min (ref >60)
GFR calc non Af Amer: >60 mL/min (ref >60)
Glucose, Bld: 129 mg/dL — ABNORMAL HIGH (ref 70–99)
Potassium: 3.7 mmol/L (ref 3.5–5.1)
Sodium: 138 mmol/L (ref 135–145)

## 2020-02-23 LAB — SURGICAL PATHOLOGY

## 2020-02-23 MED ORDER — OXYCODONE HCL 5 MG PO TABS
ORAL_TABLET | ORAL | Status: AC
  Filled 2020-02-23: qty 1

## 2020-02-23 MED ORDER — ACETAMINOPHEN 500 MG PO TABS
ORAL_TABLET | ORAL | Status: AC
  Filled 2020-02-23: qty 2

## 2020-02-23 MED ORDER — ACETAMINOPHEN 500 MG PO TABS: 1000.0000 mg | ORAL_TABLET | Freq: Four times a day (QID) | ORAL | 0 refills | Status: DC

## 2020-02-23 MED ORDER — IBUPROFEN 800 MG PO TABS: 800.0000 mg | ORAL_TABLET | Freq: Three times a day (TID) | ORAL | 0 refills | Status: DC

## 2020-02-23 MED ORDER — IBUPROFEN 800 MG PO TABS
ORAL_TABLET | ORAL | Status: AC
  Filled 2020-02-23: qty 1

## 2020-02-23 MED ORDER — OXYCODONE HCL 5 MG PO TABS: 5.0000 mg | ORAL_TABLET | ORAL | 0 refills | Status: DC | PRN

## 2020-02-23 NOTE — Progress Notes (Signed)
1 Day Post-Op Procedure(s) (LRB): TOTAL LAPAROSCOPIC HYSTERECTOMY WITH BILATERAL SALPINGECTOMY, RIGHT OOPHORECTOMY (Bilateral) CYSTOSCOPY (N/A)  Subjective: Patient reports tolerating PO and no problems voiding.  Minimal pain, had a good night  Objective: I have reviewed patient's vital signs, intake and output and labs.  General: alert and cooperative GI: incision: well approximated and soft NT Vaginal Bleeding: minimal  Assessment: s/p Procedure(s): TOTAL LAPAROSCOPIC HYSTERECTOMY WITH BILATERAL SALPINGECTOMY, RIGHT OOPHORECTOMY (Bilateral) CYSTOSCOPY (N/A): progressing well  Plan: Discharge home  LOS: 0 days    Melinda Hines 02/23/2020, 8:24 AM

## 2020-02-23 NOTE — Discharge Instructions (Signed)

## 2020-03-01 DIAGNOSIS — G8918 Other acute postprocedural pain: Secondary | ICD-10-CM | POA: Diagnosis not present

## 2020-04-05 DIAGNOSIS — Z6836 Body mass index (BMI) 36.0-36.9, adult: Secondary | ICD-10-CM | POA: Diagnosis not present

## 2020-04-05 DIAGNOSIS — Z1231 Encounter for screening mammogram for malignant neoplasm of breast: Secondary | ICD-10-CM | POA: Diagnosis not present

## 2020-04-05 DIAGNOSIS — Z01419 Encounter for gynecological examination (general) (routine) without abnormal findings: Secondary | ICD-10-CM | POA: Diagnosis not present

## 2020-04-26 DIAGNOSIS — D485 Neoplasm of uncertain behavior of skin: Secondary | ICD-10-CM | POA: Diagnosis not present

## 2020-04-26 DIAGNOSIS — L814 Other melanin hyperpigmentation: Secondary | ICD-10-CM | POA: Diagnosis not present

## 2020-04-26 DIAGNOSIS — Z85828 Personal history of other malignant neoplasm of skin: Secondary | ICD-10-CM | POA: Diagnosis not present

## 2020-04-26 DIAGNOSIS — D225 Melanocytic nevi of trunk: Secondary | ICD-10-CM | POA: Diagnosis not present

## 2020-04-26 DIAGNOSIS — L821 Other seborrheic keratosis: Secondary | ICD-10-CM | POA: Diagnosis not present

## 2020-06-03 DIAGNOSIS — Z20822 Contact with and (suspected) exposure to covid-19: Secondary | ICD-10-CM | POA: Diagnosis not present

## 2020-06-03 DIAGNOSIS — Z03818 Encounter for observation for suspected exposure to other biological agents ruled out: Secondary | ICD-10-CM | POA: Diagnosis not present

## 2020-06-21 DIAGNOSIS — C44311 Basal cell carcinoma of skin of nose: Secondary | ICD-10-CM | POA: Diagnosis not present

## 2020-07-28 DIAGNOSIS — Z03818 Encounter for observation for suspected exposure to other biological agents ruled out: Secondary | ICD-10-CM | POA: Diagnosis not present

## 2020-07-28 DIAGNOSIS — Z20822 Contact with and (suspected) exposure to covid-19: Secondary | ICD-10-CM | POA: Diagnosis not present

## 2020-08-21 DIAGNOSIS — Z20822 Contact with and (suspected) exposure to covid-19: Secondary | ICD-10-CM | POA: Diagnosis not present

## 2020-08-21 DIAGNOSIS — R059 Cough, unspecified: Secondary | ICD-10-CM | POA: Diagnosis not present

## 2021-01-16 DIAGNOSIS — U071 COVID-19: Secondary | ICD-10-CM | POA: Diagnosis not present

## 2021-01-16 DIAGNOSIS — H6691 Otitis media, unspecified, right ear: Secondary | ICD-10-CM | POA: Diagnosis not present

## 2021-03-16 DIAGNOSIS — S93491A Sprain of other ligament of right ankle, initial encounter: Secondary | ICD-10-CM | POA: Diagnosis not present

## 2021-04-12 DIAGNOSIS — M25571 Pain in right ankle and joints of right foot: Secondary | ICD-10-CM | POA: Diagnosis not present

## 2021-05-02 DIAGNOSIS — Z85828 Personal history of other malignant neoplasm of skin: Secondary | ICD-10-CM | POA: Diagnosis not present

## 2021-05-02 DIAGNOSIS — L814 Other melanin hyperpigmentation: Secondary | ICD-10-CM | POA: Diagnosis not present

## 2021-05-02 DIAGNOSIS — D225 Melanocytic nevi of trunk: Secondary | ICD-10-CM | POA: Diagnosis not present

## 2021-05-02 DIAGNOSIS — L821 Other seborrheic keratosis: Secondary | ICD-10-CM | POA: Diagnosis not present

## 2021-05-10 DIAGNOSIS — M25571 Pain in right ankle and joints of right foot: Secondary | ICD-10-CM | POA: Diagnosis not present

## 2021-05-29 ENCOUNTER — Other Ambulatory Visit: Payer: Self-pay

## 2021-05-29 ENCOUNTER — Other Ambulatory Visit (HOSPITAL_BASED_OUTPATIENT_CLINIC_OR_DEPARTMENT_OTHER): Payer: Self-pay | Admitting: Physician Assistant

## 2021-05-29 ENCOUNTER — Ambulatory Visit (HOSPITAL_BASED_OUTPATIENT_CLINIC_OR_DEPARTMENT_OTHER)
Admission: RE | Admit: 2021-05-29 | Discharge: 2021-05-29 | Disposition: A | Discharge status: Home / Self Care | Payer: BC Managed Care – PPO | Source: Ambulatory Visit | Attending: Physician Assistant | Admitting: Physician Assistant

## 2021-05-29 DIAGNOSIS — M79661 Pain in right lower leg: Secondary | ICD-10-CM | POA: Diagnosis not present

## 2021-05-29 DIAGNOSIS — M79662 Pain in left lower leg: Secondary | ICD-10-CM | POA: Diagnosis not present

## 2021-05-29 DIAGNOSIS — R1031 Right lower quadrant pain: Secondary | ICD-10-CM | POA: Diagnosis not present

## 2021-05-29 DIAGNOSIS — M79669 Pain in unspecified lower leg: Secondary | ICD-10-CM | POA: Insufficient documentation

## 2021-06-21 DIAGNOSIS — Z6839 Body mass index (BMI) 39.0-39.9, adult: Secondary | ICD-10-CM | POA: Diagnosis not present

## 2021-06-21 DIAGNOSIS — Z1151 Encounter for screening for human papillomavirus (HPV): Secondary | ICD-10-CM | POA: Diagnosis not present

## 2021-06-21 DIAGNOSIS — Z130 Encounter for screening for diseases of the blood and blood-forming organs and certain disorders involving the immune mechanism: Secondary | ICD-10-CM | POA: Diagnosis not present

## 2021-06-21 DIAGNOSIS — Z124 Encounter for screening for malignant neoplasm of cervix: Secondary | ICD-10-CM | POA: Diagnosis not present

## 2021-06-21 DIAGNOSIS — Z1231 Encounter for screening mammogram for malignant neoplasm of breast: Secondary | ICD-10-CM | POA: Diagnosis not present

## 2021-06-21 DIAGNOSIS — Z01419 Encounter for gynecological examination (general) (routine) without abnormal findings: Secondary | ICD-10-CM | POA: Diagnosis not present

## 2021-07-17 DIAGNOSIS — K5904 Chronic idiopathic constipation: Secondary | ICD-10-CM | POA: Diagnosis not present

## 2021-07-17 DIAGNOSIS — R1031 Right lower quadrant pain: Secondary | ICD-10-CM | POA: Diagnosis not present

## 2021-07-17 DIAGNOSIS — Z1211 Encounter for screening for malignant neoplasm of colon: Secondary | ICD-10-CM | POA: Diagnosis not present

## 2021-07-17 DIAGNOSIS — R635 Abnormal weight gain: Secondary | ICD-10-CM | POA: Diagnosis not present

## 2021-10-25 DIAGNOSIS — Z780 Asymptomatic menopausal state: Secondary | ICD-10-CM | POA: Diagnosis not present

## 2022-02-07 ENCOUNTER — Encounter (INDEPENDENT_AMBULATORY_CARE_PROVIDER_SITE_OTHER): Payer: Self-pay

## 2022-02-12 ENCOUNTER — Ambulatory Visit (INDEPENDENT_AMBULATORY_CARE_PROVIDER_SITE_OTHER): Payer: Self-pay | Admitting: Family Medicine

## 2022-02-26 ENCOUNTER — Ambulatory Visit (INDEPENDENT_AMBULATORY_CARE_PROVIDER_SITE_OTHER): Payer: Self-pay | Admitting: Family Medicine

## 2022-03-31 IMAGING — US US EXTREM LOW VENOUS
1 series · 14 of 24 positions shown · non-contrast
Comparison: None.

CLINICAL DATA: Bilateral calf tenderness

History of smoking
EXAM:
BILATERAL LOWER EXTREMITY VENOUS DOPPLER ULTRASOUND
TECHNIQUE: Gray-scale sonography with compression, as well as color and duplex
ultrasound, were performed to evaluate the deep venous system(s)
from the level of the common femoral vein through the popliteal and
proximal calf veins.

[Series 1: us venous img lower bilat (dvt) · portal-venous · 14 of 57 slices shown]
[im 1/57]
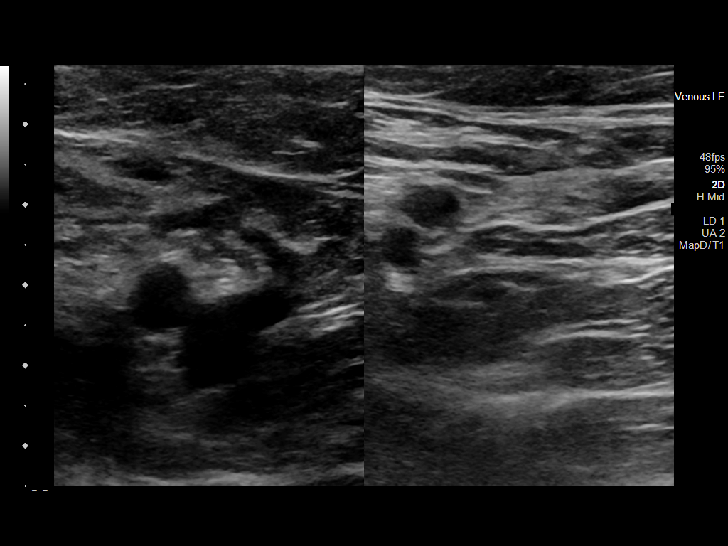
[im 5/57]
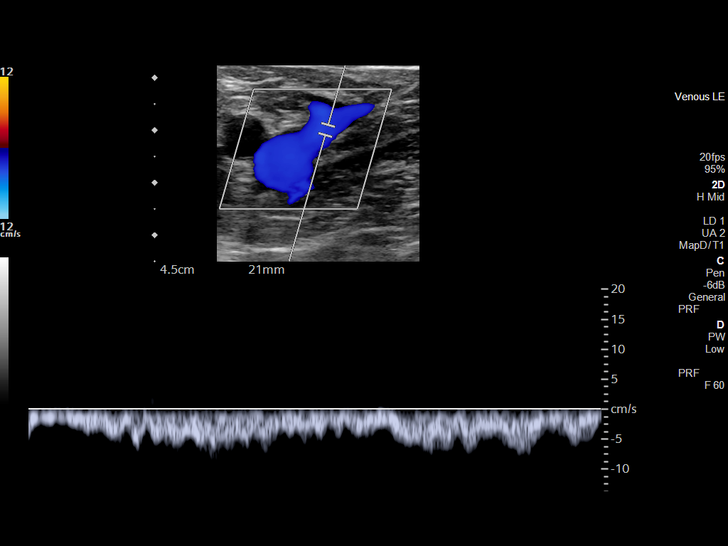
[im 10/57]
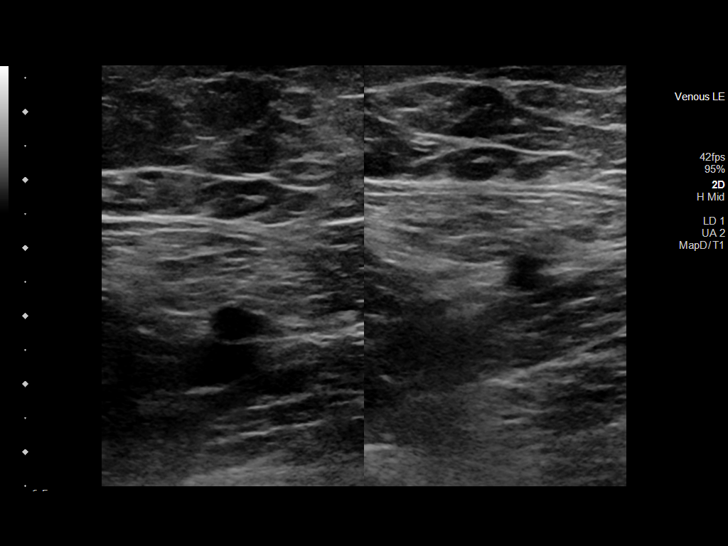
[im 15/57]
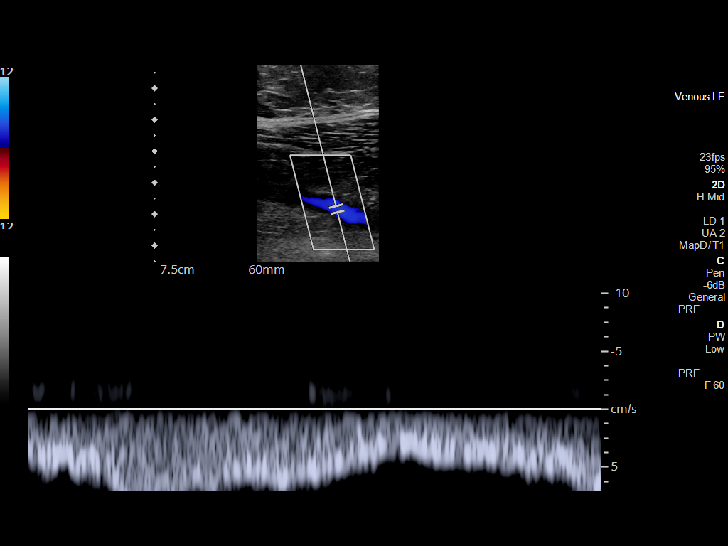
[im 18/57]
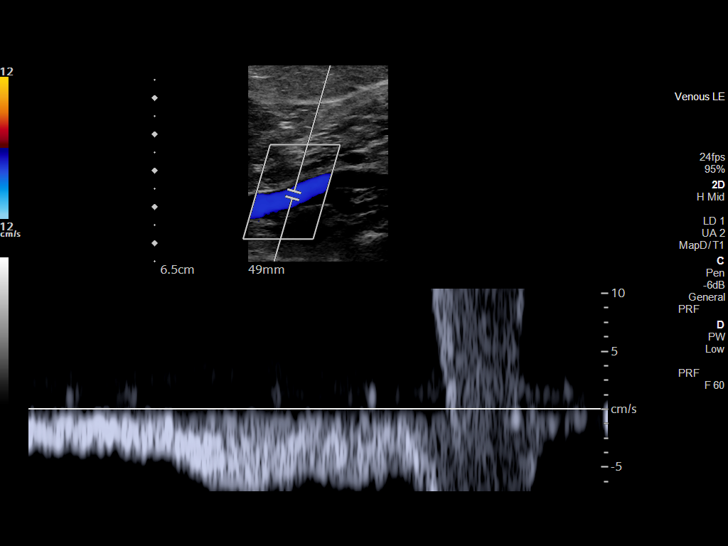
[im 22/57]
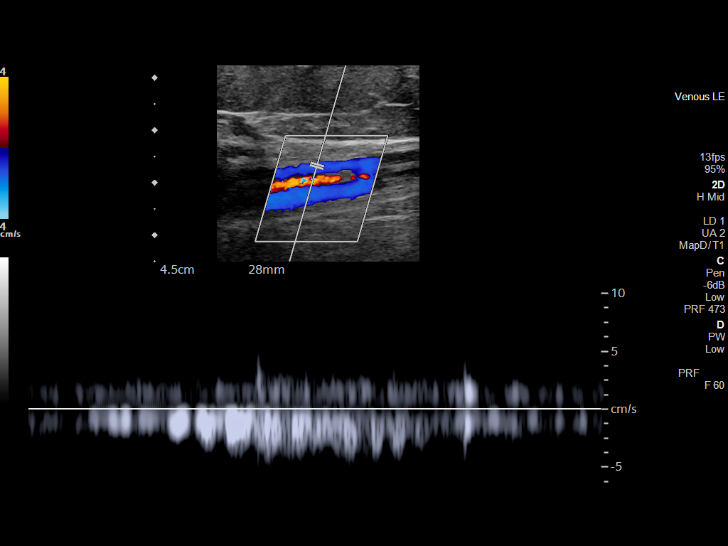
[im 27/57]
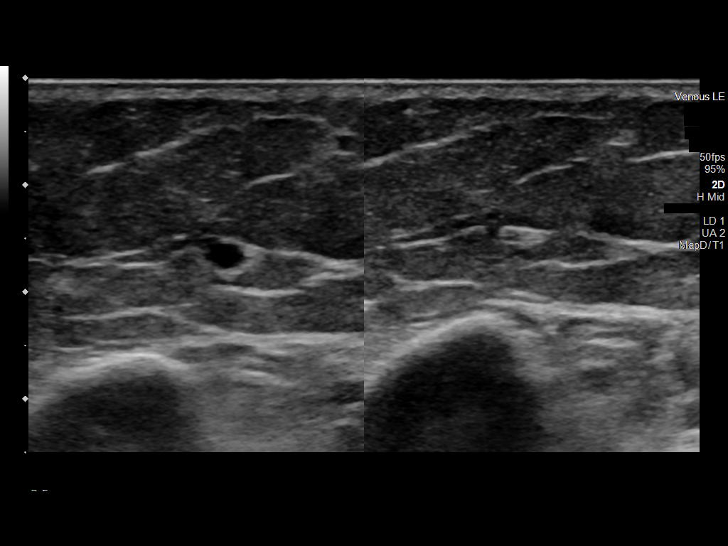
[im 30/57]
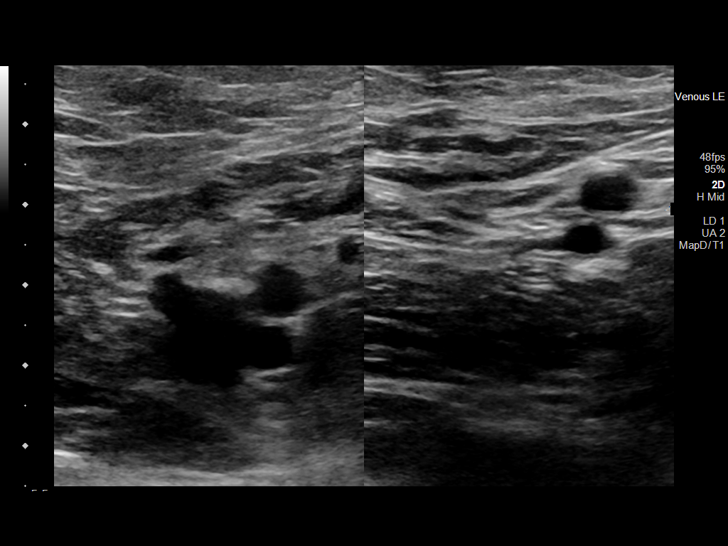
[im 35/57]
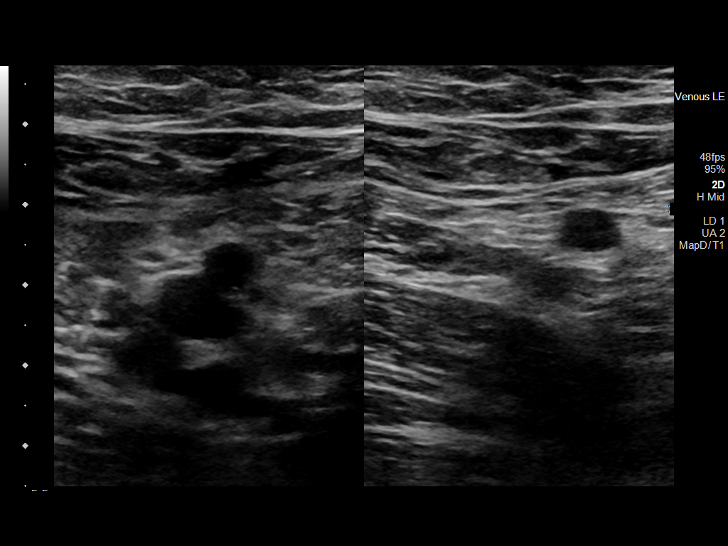
[im 39/57]
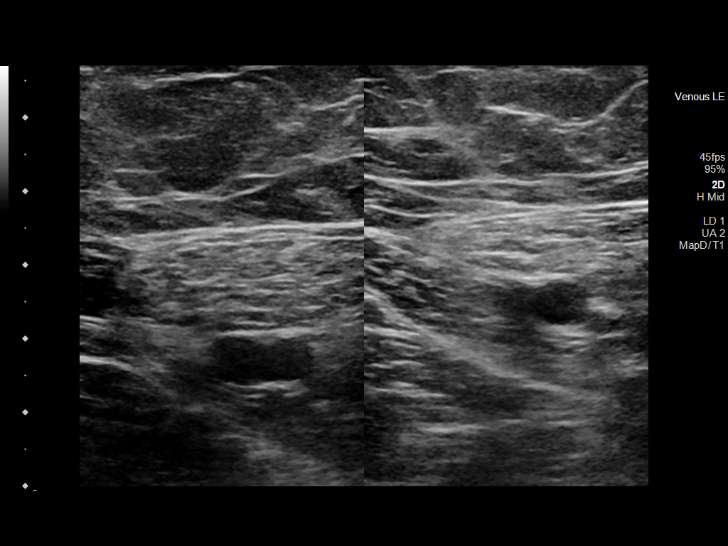
[im 44/57]
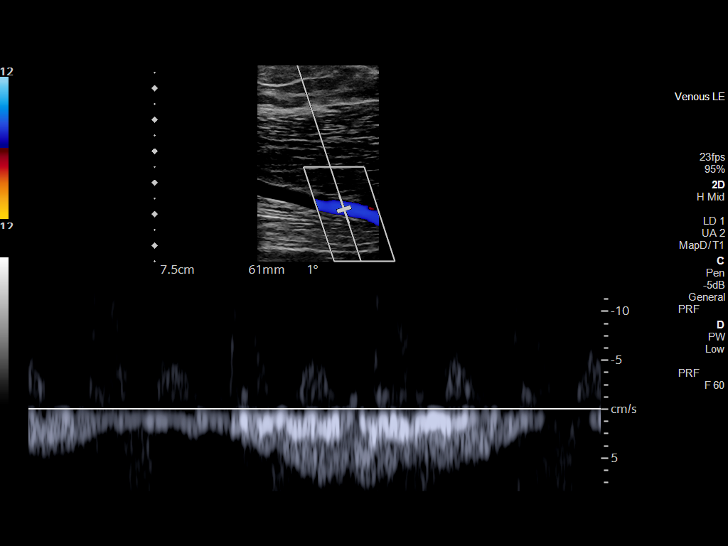
[im 47/57]
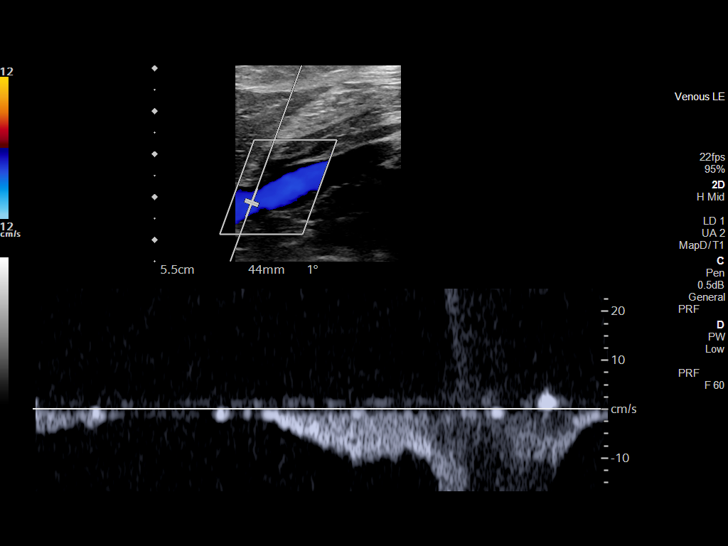
[im 52/57]
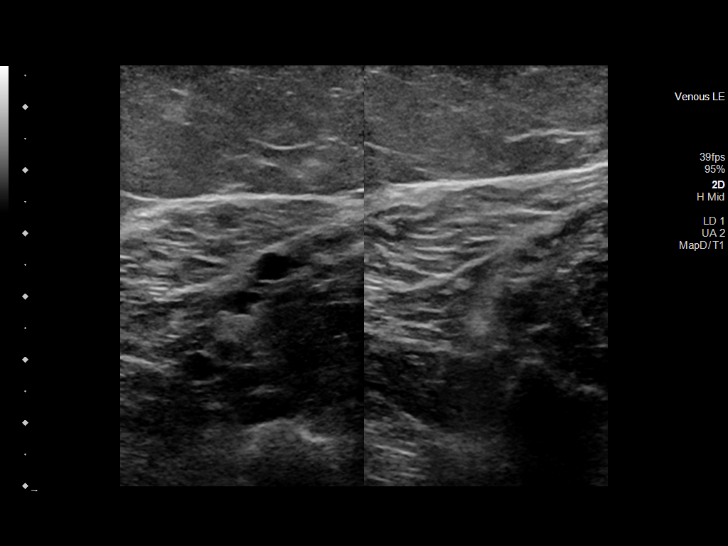
[im 57/57]
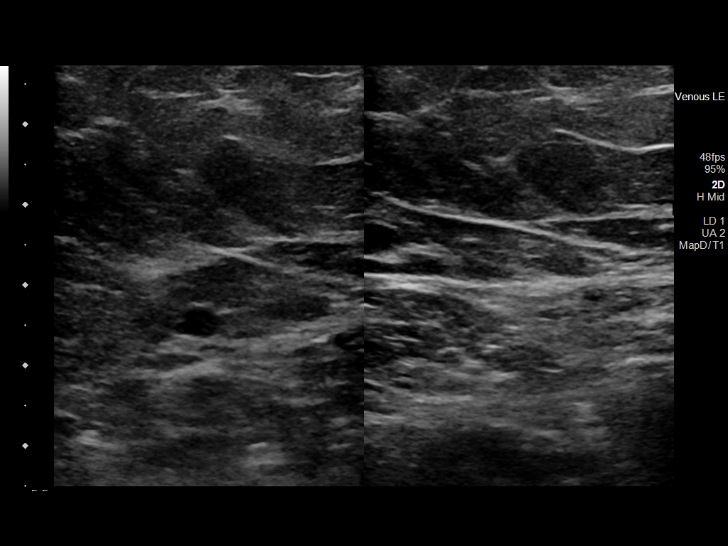

[14 of 24 positions shown; findings below may reference images not displayed]

FINDINGS: VENOUS

Normal compressibility of the common femoral, superficial femoral,
and popliteal veins, as well as the visualized calf veins.
Visualized portions of profunda femoral vein and great saphenous
vein unremarkable. No filling defects to suggest DVT on grayscale or
color Doppler imaging. Doppler waveforms show normal direction of
venous flow, normal respiratory plasticity and response to
augmentation.

OTHER

None.

Limitations: none
IMPRESSION: No lower extremity DVT.

## 2022-04-08 DIAGNOSIS — Z0289 Encounter for other administrative examinations: Secondary | ICD-10-CM

## 2022-04-09 ENCOUNTER — Ambulatory Visit (INDEPENDENT_AMBULATORY_CARE_PROVIDER_SITE_OTHER): Payer: 59 | Admitting: Family Medicine

## 2022-04-09 ENCOUNTER — Encounter (INDEPENDENT_AMBULATORY_CARE_PROVIDER_SITE_OTHER): Payer: Self-pay | Admitting: Family Medicine

## 2022-04-09 VITALS — BP 130/84 | HR 72 | Temp 97.8°F | Ht 62.0 in | Wt 216.0 lb

## 2022-04-09 DIAGNOSIS — R0602 Shortness of breath: Secondary | ICD-10-CM

## 2022-04-09 DIAGNOSIS — E669 Obesity, unspecified: Secondary | ICD-10-CM

## 2022-04-09 DIAGNOSIS — E559 Vitamin D deficiency, unspecified: Secondary | ICD-10-CM | POA: Diagnosis not present

## 2022-04-09 DIAGNOSIS — E66812 Obesity, class 2: Secondary | ICD-10-CM

## 2022-04-09 DIAGNOSIS — R7309 Other abnormal glucose: Secondary | ICD-10-CM | POA: Diagnosis not present

## 2022-04-09 DIAGNOSIS — F32A Depression, unspecified: Secondary | ICD-10-CM | POA: Insufficient documentation

## 2022-04-09 DIAGNOSIS — F3289 Other specified depressive episodes: Secondary | ICD-10-CM | POA: Diagnosis not present

## 2022-04-09 DIAGNOSIS — Z6839 Body mass index (BMI) 39.0-39.9, adult: Secondary | ICD-10-CM

## 2022-04-09 DIAGNOSIS — R5383 Other fatigue: Secondary | ICD-10-CM | POA: Diagnosis not present

## 2022-04-09 DIAGNOSIS — Z13220 Encounter for screening for lipoid disorders: Secondary | ICD-10-CM | POA: Diagnosis not present

## 2022-04-10 LAB — COMPREHENSIVE METABOLIC PANEL
ALT: 21 IU/L (ref 0–32)
AST: 19 IU/L (ref 0–40)
Albumin/Globulin Ratio: 1.8 (ref 1.2–2.2)
Albumin: 4.3 g/dL (ref 3.8–4.9)
Alkaline Phosphatase: 99 IU/L (ref 44–121)
BUN/Creatinine Ratio: 15 (ref 9–23)
BUN: 11 mg/dL (ref 6–24)
Bilirubin Total: 0.7 mg/dL (ref 0.0–1.2)
CO2: 26 mmol/L (ref 20–29)
Calcium: 9.5 mg/dL (ref 8.7–10.2)
Chloride: 100 mmol/L (ref 96–106)
Creatinine, Ser: 0.72 mg/dL (ref 0.57–1.00)
Globulin, Total: 2.4 g/dL (ref 1.5–4.5)
Glucose: 83 mg/dL (ref 70–99)
Potassium: 5.1 mmol/L (ref 3.5–5.2)
Sodium: 139 mmol/L (ref 134–144)
Total Protein: 6.7 g/dL (ref 6.0–8.5)
eGFR: 101 mL/min/{1.73_m2} (ref >59)

## 2022-04-10 LAB — CBC WITH DIFFERENTIAL/PLATELET
Basophils Absolute: 0 10*3/uL (ref 0.0–0.2)
Basos: 0 %
EOS (ABSOLUTE): 0.1 10*3/uL (ref 0.0–0.4)
Eos: 2 %
Hematocrit: 40 % (ref 34.0–46.6)
Hemoglobin: 13.6 g/dL (ref 11.1–15.9)
Immature Grans (Abs): 0 10*3/uL (ref 0.0–0.1)
Immature Granulocytes: 0 %
Lymphocytes Absolute: 1.1 10*3/uL (ref 0.7–3.1)
Lymphs: 20 %
MCH: 30.1 pg (ref 26.6–33.0)
MCHC: 34 g/dL (ref 31.5–35.7)
MCV: 89 fL (ref 79–97)
Monocytes Absolute: 0.4 10*3/uL (ref 0.1–0.9)
Monocytes: 7 %
Neutrophils Absolute: 4 10*3/uL (ref 1.4–7.0)
Neutrophils: 71 %
Platelets: 224 10*3/uL (ref 150–450)
RBC: 4.52 x10E6/uL (ref 3.77–5.28)
RDW: 13.3 % (ref 11.7–15.4)
WBC: 5.7 10*3/uL (ref 3.4–10.8)

## 2022-04-10 LAB — LIPID PANEL
Chol/HDL Ratio: 3.8 ratio (ref 0.0–4.4)
Cholesterol, Total: 236 mg/dL — ABNORMAL HIGH (ref 100–199)
HDL: 62 mg/dL (ref >39)
LDL Chol Calc (NIH): 136 mg/dL — ABNORMAL HIGH (ref 0–99)
Triglycerides: 216 mg/dL — ABNORMAL HIGH (ref 0–149)
VLDL Cholesterol Cal: 38 mg/dL (ref 5–40)

## 2022-04-10 LAB — T4, FREE: Free T4: 1.3 ng/dL (ref 0.82–1.77)

## 2022-04-10 LAB — HEMOGLOBIN A1C
Est. average glucose Bld gHb Est-mCnc: 114 mg/dL
Hgb A1c MFr Bld: 5.6 % (ref 4.8–5.6)

## 2022-04-10 LAB — VITAMIN B12: Vitamin B-12: 516 pg/mL (ref 232–1245)

## 2022-04-10 LAB — INSULIN, RANDOM: INSULIN: 8.1 u[IU]/mL (ref 2.6–24.9)

## 2022-04-10 LAB — VITAMIN D 25 HYDROXY (VIT D DEFICIENCY, FRACTURES): Vit D, 25-Hydroxy: 28.2 ng/mL — ABNORMAL LOW (ref 30.0–100.0)

## 2022-04-10 LAB — TSH: TSH: 1.46 u[IU]/mL (ref 0.450–4.500)

## 2022-04-10 NOTE — Progress Notes (Signed)
Chief Complaint:   Melinda Hines (MR# [id number]) is a 51 y.o. female who presents for evaluation and treatment of Melinda and related comorbidities. Current BMI is Body mass index is 39.51 kg/m. Melinda Hines has been struggling with her weight for many years and has been unsuccessful in either losing weight, maintaining weight loss, or reaching her healthy weight goal.  Melinda Hines's weight has been up and down since her 72s.  Is a family history of Melinda.  Avoid sugary drinks.  Has sedentary job.  Gaining weight following TAH 1 year ago.  Divorced with a good support system.  Has some work stress.  Skips lunch at work due to busy work schedule.  No regular exercise, but she has a treadmill.  Melinda Hines is currently in the action stage of change and ready to dedicate time achieving and maintaining a healthier weight. Melinda Hines is interested in becoming our patient and working on intensive lifestyle modifications including (but not limited to) diet and exercise for weight loss.  Melinda Hines's habits were reviewed today and are as follows: Her family eats meals together, she thinks her family will eat healthier with her, she struggles with family and or coworkers weight loss sabotage, her desired weight loss is 76 lbs, she has been heavy most of her life, she started gaining weight after hysterectomy, her heaviest weight Melinda Hines was 217 pounds, she is a picky eater and doesn't like to eat healthier foods, she has significant food cravings issues, she skips meals frequently, she is frequently drinking liquids with calories, she frequently makes poor food choices, and she struggles with emotional eating.  Depression Screen Melinda Hines's Food and Mood (modified PHQ-9) score was 21.     04/09/2022   10:55 AM  Depression screen PHQ 2/9  Decreased Interest 3  Down, Depressed, Hopeless 1  PHQ - 2 Score 4  Altered sleeping 3  Tired, decreased energy 3  Change in appetite 3  Feeling bad or failure about  yourself  1  Trouble concentrating 3  Moving slowly or fidgety/restless 3  Suicidal thoughts 1  PHQ-9 Score 21  Difficult doing work/chores Extremely dIfficult   Subjective:   1. Other fatigue Melinda Hines admits to daytime somnolence and admits to waking up still tired. Patient has a history of symptoms of daytime fatigue and morning fatigue. Melinda Hines generally gets 5 hours of sleep per night, and states that she has nightime awakenings. Snoring is present. Apneic episodes are present. Epworth Sleepiness Score is 8.   2. SOBOE (shortness of breath on exertion) Melinda Hines notes increasing shortness of breath with exercising and seems to be worsening over time with weight gain. She notes getting out of breath sooner with activity than she used to. This has not gotten worse recently. [name] denies shortness of breath at rest or orthopnea.  3. Elevated glucose Melinda Hines has no recent A1c in her chart.  She denies a family history of type 2 diabetes mellitus.  4. Vitamin D deficiency Melinda Hines is not on vitamin D supplementation.  5. Screening, lipid Melinda Hines's last FLP was 1 year ago.  6. Other depression, with emotional eating Melinda Hines PHQ-9 score is 21.  She is currently not on medications.  She has a good support system.  Assessment/Plan:   1. Other fatigue Melinda Hines does feel that her weight is causing her energy to be lower than it should be. Fatigue may be related to Melinda, depression or many other causes. Labs will be ordered, and in the meanwhile, Melinda Hines will  focus on self care including making healthy food choices, increasing physical activity and focusing on stress reduction.  - EKG 12-Lead - Vitamin B12 - CBC with Differential/Platelet - Comprehensive metabolic panel - T4, free - TSH  2. SOBOE (shortness of breath on exertion) Melinda Hines does feel that she gets out of breath more easily that she used to when she exercises. Melinda Hines's shortness of breath appears to be Melinda related  and exercise induced. She has agreed to work on weight loss and gradually increase exercise to treat her exercise induced shortness of breath. Will continue to monitor closely.  3. Elevated glucose We will check labs today.  Melinda Hines will begin working on dietary changes and increase her walking time.  - Hemoglobin A1c - Insulin, random  4. Vitamin D deficiency We will check labs today, and we will follow-up as Melinda Hines next visit.  - VITAMIN D 25 Hydroxy (Vit-D Deficiency, Fractures)  5. Screening, lipid We will check labs today, and we will follow-up as Melinda Hines next visit.  - Lipid panel  6. Other depression, with emotional eating Melinda Hines will plan to start Wellbutrin SR at her next visit.  She will consider use of cognitive behavioral therapy with Dr. Mallie Mussel, our bariatric psychologist.  7. Melinda, current BMI 39.6 Melinda Hines is currently in the action stage of change and her goal is to continue with weight loss efforts. I recommend Melinda Hines begin the structured treatment plan as follows:  She has agreed to the Category 1 Plan.  Reviewed IC results.  EKG-normal sinus rhythm 69 bpm.  Exercise goals: No exercise has been prescribed at this time.   Behavioral modification strategies: increasing lean protein intake, increasing water intake, no skipping meals, and better snacking choices.  She was informed of the importance of frequent follow-up visits to maximize her success with intensive lifestyle modifications for her multiple health conditions. She was informed we would discuss her lab results at her next visit unless there is a critical issue that needs to be addressed sooner. Melinda Hines agreed to keep her next visit at the agreed upon time to discuss these results.  Objective:   Blood pressure 130/84, pulse 72, temperature 97.8 F (36.6 C), height '5\' 2"'$  (1.575 m), weight 216 lb (98 kg), SpO2 96 %. Body mass index is 39.51 kg/m.  EKG: Normal sinus rhythm, rate 69  BPM.  Indirect Calorimeter completed today shows a VO2 of 205 and a REE of 1411.  Her calculated basal metabolic rate is 4193 thus her basal metabolic rate is worse than expected.  General: Cooperative, alert, well developed, in no acute distress. HEENT: Conjunctivae and lids unremarkable. Cardiovascular: Regular rhythm.  Lungs: Normal work of breathing. Neurologic: No focal deficits.   Lab Results  Component Value Date   CREATININE 0.72 04/09/2022   BUN 11 04/09/2022   NA 139 04/09/2022   K 5.1 04/09/2022   CL 100 04/09/2022   CO2 26 04/09/2022   Lab Results  Component Value Date   ALT 21 04/09/2022   AST 19 04/09/2022   ALKPHOS 99 04/09/2022   BILITOT 0.7 04/09/2022   Lab Results  Component Value Date   HGBA1C 5.6 04/09/2022   Lab Results  Component Value Date   INSULIN 8.1 04/09/2022   Lab Results  Component Value Date   TSH 1.460 04/09/2022   Lab Results  Component Value Date   CHOL 236 (H) 04/09/2022   HDL 62 04/09/2022   LDLCALC 136 (H) 04/09/2022   TRIG 216 (H) 04/09/2022  CHOLHDL 3.8 04/09/2022   Lab Results  Component Value Date   WBC 5.7 04/09/2022   HGB 13.6 04/09/2022   HCT 40.0 04/09/2022   MCV 89 04/09/2022   PLT 224 04/09/2022   No results found for: "IRON", "TIBC", "FERRITIN"  Attestation Statements:   Reviewed by clinician on day of visit: allergies, medications, problem list, medical history, surgical history, family history, social history, and previous encounter notes.   Wilhemena Durie, am acting as transcriptionist for Loyal Gambler, DO.  I have reviewed the above documentation for accuracy and completeness, and I agree with the above. Dell Ponto, DO

## 2022-04-23 ENCOUNTER — Encounter (INDEPENDENT_AMBULATORY_CARE_PROVIDER_SITE_OTHER): Payer: Self-pay | Admitting: Family Medicine

## 2022-04-23 ENCOUNTER — Ambulatory Visit (INDEPENDENT_AMBULATORY_CARE_PROVIDER_SITE_OTHER): Payer: 59 | Admitting: Family Medicine

## 2022-04-23 VITALS — BP 130/84 | HR 66 | Temp 98.0°F | Ht 62.0 in | Wt 216.0 lb

## 2022-04-23 DIAGNOSIS — E7849 Other hyperlipidemia: Secondary | ICD-10-CM

## 2022-04-23 DIAGNOSIS — Z6839 Body mass index (BMI) 39.0-39.9, adult: Secondary | ICD-10-CM

## 2022-04-23 DIAGNOSIS — E559 Vitamin D deficiency, unspecified: Secondary | ICD-10-CM | POA: Diagnosis not present

## 2022-04-23 DIAGNOSIS — F3289 Other specified depressive episodes: Secondary | ICD-10-CM | POA: Diagnosis not present

## 2022-04-23 DIAGNOSIS — E669 Obesity, unspecified: Secondary | ICD-10-CM

## 2022-04-23 MED ORDER — BUPROPION HCL ER (SR) 150 MG PO TB12: 150.0000 mg | ORAL_TABLET | Freq: Two times a day (BID) | ORAL | 0 refills | Status: AC

## 2022-04-23 MED ORDER — VITAMIN D (ERGOCALCIFEROL) 1.25 MG (50000 UNIT) PO CAPS: 50000.0000 [IU] | ORAL_CAPSULE | ORAL | 0 refills | Status: AC

## 2022-04-30 NOTE — Progress Notes (Signed)
Chief Complaint:   OBESITY Melinda Hines is here to discuss her progress with her obesity treatment plan along with follow-up of her obesity related diagnoses. Melinda Hines is on the Category 1 Plan and states she is following her eating plan approximately (unknown)% of the time. Melinda Hines states she is walking and swimming for 30 minutes 3 times per week.  Today's visit was #: 2 Starting weight: 216 lbs Starting date: 04/09/2022 Today's weight: 216 lbs Today's date: 04/23/2022 Total lbs lost to date: 0 Total lbs lost since last in-office visit: 0  Interim History: Melinda Hines did well with her eating plan for 4 days then went to the [place] and had 10 beers over the weekend, then had a large high carb meal.  She continues to stress eat.  Has added in some walking and swimming.  Has an upcoming trip for 3 weeks in August.  Retaining water from daily intake of deli meat.  Subjective:   1. Other hyperlipidemia Melinda Hines's total cholesterol was 236, triglycerides 216, and LDL 136 on 04/09/2022.  She is not on medications.  2. Vitamin D deficiency Melinda Hines's vitamin D level was 28.2, and she is not taking vitamin D.  3. Other depression, with emotional eating Melinda Hines's PHQ-9 score was 21 at her last visit.  She is not on treatment.  Assessment/Plan:   1. Other hyperlipidemia Anticipate lipid improvements with improved diet, exercise, and weight loss.  We will recheck FLP in 4 months.  2. Vitamin D deficiency Melinda Hines agreed to start prescription vitamin D 50,000 units once weekly, with no refills.  We will recheck vitamin D level in 3 months.  - Vitamin D, Ergocalciferol, (DRISDOL) 1.25 MG (50000 UNIT) CAPS capsule; Take 1 capsule (50,000 Units total) by mouth every 7 (seven) days.  Dispense: 5 capsule; Refill: 0  3. Other depression, with emotional eating Melinda Hines agreed to begin Wellbutrin SR 150 mg twice daily with no refills.  We discussed potential adverse SEs.  She denies a hx of  seizures.  - buPROPion (WELLBUTRIN SR) 150 MG 12 hr tablet; Take 1 tablet (150 mg total) by mouth 2 (two) times daily.  Dispense: 60 tablet; Refill: 0  4. Obesity, current BMI 39.6 Melinda Hines is currently in the action stage of change. As such, her goal is to continue with weight loss efforts. She has agreed to change to keeping a food journal and adhering to recommended goals of 1200 calories and 80-90 grams of protein daily.   Exercise goals: As is.   Behavioral modification strategies: increasing water intake, decreasing liquid calories, decreasing alcohol intake, decreasing sodium intake, emotional eating strategies, and keeping a strict food journal.  Melinda Hines has agreed to follow-up with our clinic in 4 weeks. She was informed of the importance of frequent follow-up visits to maximize her success with intensive lifestyle modifications for her multiple health conditions.   Objective:   Blood pressure 130/84, pulse 66, temperature 98 F (36.7 C), height '5\' 2"'$  (1.575 m), weight 216 lb (98 kg), SpO2 97 %. Body mass index is 39.51 kg/m.  General: Cooperative, alert, well developed, in no acute distress. HEENT: Conjunctivae and lids unremarkable. Cardiovascular: Regular rhythm.  Lungs: Normal work of breathing. Neurologic: No focal deficits.   Lab Results  Component Value Date   CREATININE 0.72 04/09/2022   BUN 11 04/09/2022   NA 139 04/09/2022   K 5.1 04/09/2022   CL 100 04/09/2022   CO2 26 04/09/2022   Lab Results  Component Value Date  ALT 21 04/09/2022   AST 19 04/09/2022   ALKPHOS 99 04/09/2022   BILITOT 0.7 04/09/2022   Lab Results  Component Value Date   HGBA1C 5.6 04/09/2022   Lab Results  Component Value Date   INSULIN 8.1 04/09/2022   Lab Results  Component Value Date   TSH 1.460 04/09/2022   Lab Results  Component Value Date   CHOL 236 (H) 04/09/2022   HDL 62 04/09/2022   LDLCALC 136 (H) 04/09/2022   TRIG 216 (H) 04/09/2022   CHOLHDL 3.8 04/09/2022    Lab Results  Component Value Date   VD25OH 28.2 (L) 04/09/2022   Lab Results  Component Value Date   WBC 5.7 04/09/2022   HGB 13.6 04/09/2022   HCT 40.0 04/09/2022   MCV 89 04/09/2022   PLT 224 04/09/2022   No results found for: "IRON", "TIBC", "FERRITIN"  Attestation Statements:   Reviewed by clinician on day of visit: allergies, medications, problem list, medical history, surgical history, family history, social history, and previous encounter notes.   Wilhemena Durie, am acting as transcriptionist for Loyal Gambler, DO.  I have reviewed the above documentation for accuracy and completeness, and I agree with the above. Dell Ponto, DO

## 2022-05-08 ENCOUNTER — Encounter (INDEPENDENT_AMBULATORY_CARE_PROVIDER_SITE_OTHER): Payer: Self-pay

## 2022-05-28 ENCOUNTER — Ambulatory Visit (INDEPENDENT_AMBULATORY_CARE_PROVIDER_SITE_OTHER): Payer: 59 | Admitting: Family Medicine

## 2022-06-26 ENCOUNTER — Ambulatory Visit (INDEPENDENT_AMBULATORY_CARE_PROVIDER_SITE_OTHER): Payer: 59 | Admitting: Family Medicine

## 2022-07-02 DIAGNOSIS — R1031 Right lower quadrant pain: Secondary | ICD-10-CM | POA: Diagnosis not present

## 2022-07-03 DIAGNOSIS — R1031 Right lower quadrant pain: Secondary | ICD-10-CM | POA: Diagnosis not present

## 2022-09-10 DIAGNOSIS — R69 Illness, unspecified: Secondary | ICD-10-CM | POA: Diagnosis not present

## 2022-09-10 DIAGNOSIS — Z01419 Encounter for gynecological examination (general) (routine) without abnormal findings: Secondary | ICD-10-CM | POA: Diagnosis not present

## 2022-09-10 DIAGNOSIS — R319 Hematuria, unspecified: Secondary | ICD-10-CM | POA: Diagnosis not present

## 2022-09-10 DIAGNOSIS — Z1231 Encounter for screening mammogram for malignant neoplasm of breast: Secondary | ICD-10-CM | POA: Diagnosis not present

## 2022-09-10 DIAGNOSIS — Z1389 Encounter for screening for other disorder: Secondary | ICD-10-CM | POA: Diagnosis not present

## 2022-12-23 ENCOUNTER — Encounter (HOSPITAL_BASED_OUTPATIENT_CLINIC_OR_DEPARTMENT_OTHER): Payer: Self-pay | Admitting: Emergency Medicine

## 2022-12-23 ENCOUNTER — Emergency Department (HOSPITAL_BASED_OUTPATIENT_CLINIC_OR_DEPARTMENT_OTHER)
Admission: EM | Admit: 2022-12-23 | Discharge: 2022-12-23 | Disposition: A | Discharge status: Home / Self Care | Payer: 59 | Attending: Emergency Medicine | Admitting: Emergency Medicine

## 2022-12-23 ENCOUNTER — Other Ambulatory Visit: Payer: Self-pay

## 2022-12-23 DIAGNOSIS — M79604 Pain in right leg: Secondary | ICD-10-CM | POA: Diagnosis not present

## 2022-12-23 DIAGNOSIS — M79661 Pain in right lower leg: Secondary | ICD-10-CM | POA: Diagnosis not present

## 2022-12-23 LAB — BASIC METABOLIC PANEL
Anion gap: 6 (ref 5–15)
BUN: 15 mg/dL (ref 6–20)
CO2: 29 mmol/L (ref 22–32)
Calcium: 9.6 mg/dL (ref 8.9–10.3)
Chloride: 103 mmol/L (ref 98–111)
Creatinine, Ser: 0.7 mg/dL (ref 0.44–1.00)
GFR, Estimated: >60 mL/min (ref >60)
Glucose, Bld: 100 mg/dL — ABNORMAL HIGH (ref 70–99)
Potassium: 3.7 mmol/L (ref 3.5–5.1)
Sodium: 138 mmol/L (ref 135–145)

## 2022-12-23 LAB — CBC WITH DIFFERENTIAL/PLATELET
Abs Immature Granulocytes: 0.01 10*3/uL (ref 0.00–0.07)
Basophils Absolute: 0 10*3/uL (ref 0.0–0.1)
Basophils Relative: 1 %
Eosinophils Absolute: 0.2 10*3/uL (ref 0.0–0.5)
Eosinophils Relative: 4 %
HCT: 40.2 % (ref 36.0–46.0)
Hemoglobin: 13.3 g/dL (ref 12.0–15.0)
Immature Granulocytes: 0 %
Lymphocytes Relative: 29 %
Lymphs Abs: 1.2 10*3/uL (ref 0.7–4.0)
MCH: 29.7 pg (ref 26.0–34.0)
MCHC: 33.1 g/dL (ref 30.0–36.0)
MCV: 89.7 fL (ref 80.0–100.0)
Monocytes Absolute: 0.3 10*3/uL (ref 0.1–1.0)
Monocytes Relative: 7 %
Neutro Abs: 2.5 10*3/uL (ref 1.7–7.7)
Neutrophils Relative %: 59 %
Platelets: 216 10*3/uL (ref 150–400)
RBC: 4.48 MIL/uL (ref 3.87–5.11)
RDW: 13 % (ref 11.5–15.5)
WBC: 4.3 10*3/uL (ref 4.0–10.5)
nRBC: 0 % (ref 0.0–0.2)

## 2022-12-23 MED ORDER — APIXABAN 2.5 MG PO TABS
10.0000 mg | ORAL_TABLET | Freq: Once | ORAL | Status: AC
  Administered 2022-12-23: 10 mg via ORAL
  Filled 2022-12-23: qty 4

## 2022-12-23 NOTE — Discharge Instructions (Signed)
You are given Eliquis today, this is an anticoagulation.  Return tomorrow to have ultrasound done to evaluate for blood clot.  Return to the ED for new or concerning symptoms.

## 2022-12-23 NOTE — ED Triage Notes (Signed)
Pt reports possible blood clot to right leg. Pt reports leg swelling and pain. Recent travel where she was in car and then sitting for 3 days.

## 2022-12-23 NOTE — ED Provider Notes (Signed)
[facility] Provider Note   CSN: [id number] Arrival date & time: 12/23/22  1554     History  Chief Complaint  Patient presents with   Leg Swelling    Melinda Hines is a 52 y.o. female.  HPI   Patient Melinda Hines to the emergency department due to right calf pain.  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  No history of PE or ACS, denies shortness of breath.  The pain is mostly to the medial part of her right lower extremity.  Home Medications Prior to Admission medications   Medication Sig Start Date End Date Taking? Authorizing Provider  buPROPion (WELLBUTRIN SR) 150 MG 12 hr tablet Take 1 tablet (150 mg total) by mouth 2 (two) times daily. 04/23/22   Bowen, Collene Leyden, DO  cetirizine (ZYRTEC ALLERGY) 10 MG tablet 1 tablet    [provider]  [place] Patient not taking: Reported on 04/23/2022    [provider]  Vitamin D, Ergocalciferol, (DRISDOL) 1.25 MG (50000 UNIT) CAPS capsule Take 1 capsule (50,000 Units total) by mouth every 7 (seven) days. 04/23/22   Bowen, Collene Leyden, DO      Allergies    Patient has no known allergies.    Review of Systems   Review of Systems  Physical Exam Updated Vital Signs BP (!) 150/85   Pulse 72   Temp 98 F (36.7 C) (Oral)   Resp 18   Ht 5\' 2"  (1.575 m)   Wt 98 kg   SpO2 97%   BMI 39.52 kg/m  Physical Exam Vitals and nursing note reviewed. Exam conducted with a chaperone present.  Constitutional:      General: She is not in acute distress.    Appearance: Normal appearance.  HENT:     Head: Normocephalic and atraumatic.  Eyes:     General: No scleral icterus.    Extraocular Movements: Extraocular movements intact.     Pupils: Pupils are equal, round, and reactive to light.  Cardiovascular:     Pulses: Normal pulses.  Musculoskeletal:     Comments: Rle: calf tenderness   Skin:    Coloration: Skin is not  jaundiced.  Neurological:     Mental Status: She is alert. Mental status is at baseline.     Coordination: Coordination normal.     ED Results / Procedures / Treatments   Labs (all labs ordered are listed, but only abnormal results are displayed) Labs Reviewed  BASIC METABOLIC PANEL - Abnormal; Notable for the following components:      Result Value   Glucose, Bld 100 (*)    All other components within normal limits  CBC WITH DIFFERENTIAL/PLATELET    EKG None  Radiology No results found.  Procedures Procedures    Medications Ordered in ED Medications  apixaban (ELIQUIS) tablet 10 mg (10 mg Oral Given 12/23/22 1732)    ED Course/ Medical Decision Making/ A&P                             Medical Decision Making Amount and/or Complexity of Data Reviewed Labs: ordered.  Risk Prescription drug management.   Patient presents due to right lower extremity calf pain.  On exam she has palpable pulses, compartments are soft Doubt compartment syndrome or ischemic limb.  Given recent travel and risk factors concern for DVT, unfortunately ultrasound is  not available tonight.  Will check labs, give one-time dose of anticoagulation and have her return tomorrow for evaluation.  Low suspicion for cellulitis given lack of erythema on skin exam.  Laboratory workup is unremarkable.  Eliquis ordered.  Will return tomorrow for DVT rule out.        Final Clinical Impression(s) / ED Diagnoses Final diagnoses:  Pain of right lower extremity    Rx / DC Orders ED Discharge Orders          Ordered    US Venous Img Lower Unilateral Right        12/23/22 1636              Sherrill Raring, PA-C 12/23/22 1829    Charlesetta Shanks, MD 12/23/22 1940

## 2022-12-23 NOTE — ED Notes (Signed)
RN reviewed discharge instructions with pt. Pt verbalized understanding and had no further questions. VSS upon discharge.  

## 2022-12-24 ENCOUNTER — Other Ambulatory Visit (HOSPITAL_BASED_OUTPATIENT_CLINIC_OR_DEPARTMENT_OTHER): Payer: Self-pay | Admitting: Student

## 2022-12-24 ENCOUNTER — Ambulatory Visit (HOSPITAL_BASED_OUTPATIENT_CLINIC_OR_DEPARTMENT_OTHER)
Admission: RE | Admit: 2022-12-24 | Discharge: 2022-12-24 | Disposition: A | Discharge status: Home / Self Care | Payer: 59 | Source: Ambulatory Visit | Attending: Student | Admitting: Student

## 2022-12-24 DIAGNOSIS — S8011XA Contusion of right lower leg, initial encounter: Secondary | ICD-10-CM | POA: Diagnosis not present

## 2022-12-24 DIAGNOSIS — M79604 Pain in right leg: Secondary | ICD-10-CM

## 2022-12-31 DIAGNOSIS — R5382 Chronic fatigue, unspecified: Secondary | ICD-10-CM | POA: Diagnosis not present

## 2023-02-04 DIAGNOSIS — R030 Elevated blood-pressure reading, without diagnosis of hypertension: Secondary | ICD-10-CM | POA: Diagnosis not present

## 2023-02-04 DIAGNOSIS — G4719 Other hypersomnia: Secondary | ICD-10-CM | POA: Diagnosis not present

## 2023-03-02 DIAGNOSIS — H6691 Otitis media, unspecified, right ear: Secondary | ICD-10-CM | POA: Diagnosis not present

## 2023-03-02 DIAGNOSIS — R051 Acute cough: Secondary | ICD-10-CM | POA: Diagnosis not present

## 2023-03-02 DIAGNOSIS — H9203 Otalgia, bilateral: Secondary | ICD-10-CM | POA: Diagnosis not present

## 2023-03-02 DIAGNOSIS — F17200 Nicotine dependence, unspecified, uncomplicated: Secondary | ICD-10-CM | POA: Diagnosis not present

## 2023-04-22 DIAGNOSIS — F17200 Nicotine dependence, unspecified, uncomplicated: Secondary | ICD-10-CM | POA: Diagnosis not present

## 2023-07-01 DIAGNOSIS — R102 Pelvic and perineal pain: Secondary | ICD-10-CM | POA: Diagnosis not present

## 2023-07-01 DIAGNOSIS — Z720 Tobacco use: Secondary | ICD-10-CM | POA: Diagnosis not present

## 2023-09-01 DIAGNOSIS — R058 Other specified cough: Secondary | ICD-10-CM | POA: Diagnosis not present

## 2023-09-01 DIAGNOSIS — J209 Acute bronchitis, unspecified: Secondary | ICD-10-CM | POA: Diagnosis not present

## 2023-09-01 DIAGNOSIS — H6993 Unspecified Eustachian tube disorder, bilateral: Secondary | ICD-10-CM | POA: Diagnosis not present

## 2023-09-01 DIAGNOSIS — J019 Acute sinusitis, unspecified: Secondary | ICD-10-CM | POA: Diagnosis not present

## 2023-09-01 DIAGNOSIS — R062 Wheezing: Secondary | ICD-10-CM | POA: Diagnosis not present

## 2023-09-17 DIAGNOSIS — J209 Acute bronchitis, unspecified: Secondary | ICD-10-CM | POA: Diagnosis not present

## 2023-09-17 DIAGNOSIS — J019 Acute sinusitis, unspecified: Secondary | ICD-10-CM | POA: Diagnosis not present

## 2023-09-17 DIAGNOSIS — R062 Wheezing: Secondary | ICD-10-CM | POA: Diagnosis not present

## 2023-09-19 DIAGNOSIS — Z01411 Encounter for gynecological examination (general) (routine) with abnormal findings: Secondary | ICD-10-CM | POA: Diagnosis not present

## 2023-09-19 DIAGNOSIS — Z01419 Encounter for gynecological examination (general) (routine) without abnormal findings: Secondary | ICD-10-CM | POA: Diagnosis not present

## 2023-09-19 DIAGNOSIS — R635 Abnormal weight gain: Secondary | ICD-10-CM | POA: Diagnosis not present

## 2023-09-19 DIAGNOSIS — Z1231 Encounter for screening mammogram for malignant neoplasm of breast: Secondary | ICD-10-CM | POA: Diagnosis not present

## 2023-09-19 DIAGNOSIS — Z130 Encounter for screening for diseases of the blood and blood-forming organs and certain disorders involving the immune mechanism: Secondary | ICD-10-CM | POA: Diagnosis not present

## 2023-09-19 DIAGNOSIS — Z1389 Encounter for screening for other disorder: Secondary | ICD-10-CM | POA: Diagnosis not present

## 2023-09-19 DIAGNOSIS — R6882 Decreased libido: Secondary | ICD-10-CM | POA: Diagnosis not present

## 2024-06-22 NOTE — Progress Notes (Unsigned)
 Cardiology Office Note:  .   Date:  06/23/2024  ID:  Melinda Hines, DOB Apr 17, 1971, MRN [id number] PCP: Teresa Channel, MD  [facility] Providers Cardiologist:  Gordy Bergamo, MD   History of Present Illness: .   Melinda Hines is a 53 y.o. with morbid obesity, referred to me for evaluation of syncope.  Event occurred in June 2025 whenever sitting outside on a hot night and she had 2 beers, states that she had not drank a lot of water, was in usual state of health, stated she needed to go to bed, following which she fell to the ground and woke up with the dog licking her face and friends hovering over her.  Events were explained as she started to pass out and her friends caught her and gently laid her on the ground, EKG was reportedly normal had a free community clinic. No recurrence since.   Personal history significant for smoking 1/2 pack of cigarettes since age 49 years.  Presently remains asymptomatic.  Cardiac Studies relevent.      Labs   Lab Results  Component Value Date   CHOL 236 (H) 04/09/2022   HDL 62 04/09/2022   LDLCALC 136 (H) 04/09/2022   TRIG 216 (H) 04/09/2022   CHOLHDL 3.8 04/09/2022   No results found for: LIPOA  No results for input(s): NA, K, CL, CO2, GLUCOSE, BUN, CREATININE, CALCIUM , GFRNONAA, GFRAA in the last 8760 hours.  Lab Results  Component Value Date   ALT 21 04/09/2022   AST 19 04/09/2022   ALKPHOS 99 04/09/2022   BILITOT 0.7 04/09/2022      Latest Ref Rng & Units 12/23/2022    4:31 PM 04/09/2022   11:44 AM 02/23/2020    5:05 AM  CBC  WBC 4.0 - 10.5 K/uL 4.3  5.7  8.5   Hemoglobin 12.0 - 15.0 g/dL 86.6  86.3  89.8   Hematocrit 36.0 - 46.0 % 40.2  40.0  32.0   Platelets 150 - 400 K/uL 216  224  186    Lab Results  Component Value Date   HGBA1C 5.6 04/09/2022    Lab Results  Component Value Date   TSH 1.460 04/09/2022    Care everywhere/Faxed External Labs:  Labs 05/20/2023:  Serum glucose 86 mg, serum  creatinine 0.74, eGFR 97 mL, potassium 4.6.  Hb 13.5/HCT 40.1, platelets 228.  A1c 5.9%.  ROS  Review of Systems  Cardiovascular:  Negative for chest pain, dyspnea on exertion and leg swelling.   Physical Exam:   VS:  BP (!) 140/83 (BP Location: Left Arm, Patient Position: Sitting, Cuff Size: Large)   Pulse 71   Ht 5' 2 (1.575 m)   Wt 236 lb 6.4 oz (107.2 kg)   SpO2 96%   BMI 43.24 kg/m    Wt Readings from Last 3 Encounters:  06/23/24 236 lb 6.4 oz (107.2 kg)  12/23/22 216 lb 0.8 oz (98 kg)  04/23/22 216 lb (98 kg)    BP Readings from Last 3 Encounters:  06/23/24 (!) 140/83  12/23/22 (!) 150/85  04/23/22 130/84   Orthostatic VS for the past 24 hrs (Last 3 readings):  BP- Lying Pulse- Lying BP- Sitting Pulse- Sitting BP- Standing at 0 minutes Pulse- Standing at 0 minutes  06/23/24 0907 138/83 83 126/86 80 113/86 78    Physical Exam Constitutional:      Appearance: She is morbidly obese.  Neck:     Vascular: No carotid bruit or JVD.  Cardiovascular:     Rate and Rhythm: Normal rate and regular rhythm.     Pulses: Intact distal pulses.     Heart sounds: Normal heart sounds. No murmur heard.    No gallop.  Pulmonary:     Effort: Pulmonary effort is normal.     Breath sounds: Normal breath sounds.  Abdominal:     General: Bowel sounds are normal.     Palpations: Abdomen is soft.  Musculoskeletal:     Right lower leg: No edema.     Left lower leg: No edema.    EKG:    EKG Interpretation Date/Time:  Wednesday June 23 2024 09:07:03 EDT Ventricular Rate:  71 PR Interval:  152 QRS Duration:  82 QT Interval:  406 QTC Calculation: 441 R Axis:   52  Text Interpretation: EKG 06/23/2024: Normal sinus rhythm at the rate of 71 bpm, normal EKG.  Compared to 02/15/2020, no significant change. Confirmed by Chandi Nicklin, Jagadeesh ([id number]) on 06/23/2024 9:22:54 AM    ASSESSMENT AND PLAN: .      ICD-10-CM   1. Heat syncope, subsequent encounter  T67.1XXD EKG 12-Lead    2.  Mixed hyperlipidemia  E78.2 Lipid Panel With LDL/HDL Ratio    Lipoprotein A (LPA)    CT CARDIAC SCORING (SELF PAY ONLY)    3. Tobacco use disorder  F17.200 CT CARDIAC SCORING (SELF PAY ONLY)    4. Elevated blood pressure reading in office without diagnosis of hypertension  R03.0       Assessment and Plan  1. Heat syncope, subsequent encounter (Primary) Her presentation is not consistent with cardiac etiology for syncope.  She was mildly orthostatic today but again patient counseled, do not suspect any other abnormality in the absence of recurrence of symptoms.  Good hydration and fall precautions discussed. - EKG 12-Lead  2. Mixed hyperlipidemia Reviewed her labs from PCP, also reviewed her prior lipid profile which elevated mixed hypercholesterolemia.  Decision to treat hyperlipidemia are not as a question who tobacco use disorder is a major risk factor along with morbid obesity and mild hyperglycemia.  Will obtain coronary calcium  score for guidance, absence of elevated coronary calcium  score does not exclude that she is still at high risk for cardiovascular events however calcium  scoring may guide us  in aggressiveness of lipid management.  Will obtain lipid profile testing along with Lp(a) and coronary calcium  score as she has not had done since 2023.  I will review her labs and CT scan and make final recommendation and upon which she can follow-up with Dr. Montie Pizza for primary prevention as she is asymptomatic. - Lipid Panel With LDL/HDL Ratio - Lipoprotein A (LPA) - CT CARDIAC SCORING (SELF PAY ONLY); Future  3. Tobacco use disorder Extensive discussion with the patient regarding smoking cessation.  She has been smoking about half pack of cigarettes a day for the last 24 years or more since age 38.  Discussed with her regarding high risk of cardiovascular events, high risk of cancer especially in view of obesity offered help in medications and nicotine patches.  Patient appears to  be motivated.  Additional 6 minutes spent providing counseling.  Obesity was also discussed in the detail, she does not want to try GLP-1 agonist but is trying natural supplements. - CT CARDIAC SCORING (SELF PAY ONLY); Future  4.  Elevated blood pressure without diagnosis of hypertension Patient states that her blood pressure is usually around 120s to 130s at most and this is not abnormal but it  is elevated today, advised her to continue to keep a watch on this, goal blood pressure 130/80 mmHg or less.  She does 15 metabolic syndrome category with elevated blood pressure, hyperglycemia and hypertension ischemia.  Follow up: As needed.  Signed,  Gordy Bergamo, MD, [facility] 06/23/2024, 8:13 PM [facility] [address] [place], [place] [address] Phone: [phone]. Fax:  [phone]

## 2024-06-23 ENCOUNTER — Encounter: Payer: Self-pay | Admitting: Cardiology

## 2024-06-23 ENCOUNTER — Ambulatory Visit: Attending: Cardiology | Admitting: Cardiology

## 2024-06-23 VITALS — BP 140/83 | HR 71 | Ht 62.0 in | Wt 236.4 lb

## 2024-06-23 DIAGNOSIS — F17200 Nicotine dependence, unspecified, uncomplicated: Secondary | ICD-10-CM | POA: Diagnosis not present

## 2024-06-23 DIAGNOSIS — T671XXD Heat syncope, subsequent encounter: Secondary | ICD-10-CM

## 2024-06-23 DIAGNOSIS — E782 Mixed hyperlipidemia: Secondary | ICD-10-CM

## 2024-06-23 DIAGNOSIS — R55 Syncope and collapse: Secondary | ICD-10-CM

## 2024-06-23 DIAGNOSIS — F17210 Nicotine dependence, cigarettes, uncomplicated: Secondary | ICD-10-CM | POA: Diagnosis not present

## 2024-06-23 DIAGNOSIS — R030 Elevated blood-pressure reading, without diagnosis of hypertension: Secondary | ICD-10-CM

## 2024-06-23 NOTE — Patient Instructions (Signed)
 Medication Instructions:   No changes   Lab Work: Not needed    Testing/Procedures: Not needed   Follow-Up: At [facility], you and your health needs are our priority.  As part of our continuing mission to provide you with exceptional heart care, we have created designated Provider Care Teams.  These Care Teams include your primary Cardiologist (physician) and Advanced Practice Providers (APPs -  Physician Assistants and Nurse Practitioners) who all work together to provide you with the care you need, when you need it.     Your next appointment:   As needed   The format for your next appointment:   In Person  Provider:   Gordy Bergamo, MD

## 2024-06-24 ENCOUNTER — Ambulatory Visit: Payer: Self-pay | Admitting: Cardiology

## 2024-06-24 DIAGNOSIS — E782 Mixed hyperlipidemia: Secondary | ICD-10-CM

## 2024-06-24 LAB — LIPOPROTEIN A (LPA): Lipoprotein (a): 176.1 nmol/L — ABNORMAL HIGH (ref <75.0)

## 2024-06-24 LAB — LIPID PANEL WITH LDL/HDL RATIO
Cholesterol, Total: 235 mg/dL — ABNORMAL HIGH (ref 100–199)
HDL: 52 mg/dL (ref >39)
LDL Chol Calc (NIH): 145 mg/dL — ABNORMAL HIGH (ref 0–99)
LDL/HDL Ratio: 2.8 ratio (ref 0.0–3.2)
Triglycerides: 213 mg/dL — ABNORMAL HIGH (ref 0–149)
VLDL Cholesterol Cal: 38 mg/dL (ref 5–40)

## 2024-06-24 MED ORDER — ROSUVASTATIN CALCIUM 20 MG PO TABS: 20.0000 mg | ORAL_TABLET | Freq: Every day | ORAL | 3 refills | Status: AC

## 2024-06-24 MED ORDER — EZETIMIBE 10 MG PO TABS: 10.0000 mg | ORAL_TABLET | Freq: Every day | ORAL | 3 refills | Status: AC

## 2024-06-30 ENCOUNTER — Ambulatory Visit (HOSPITAL_COMMUNITY)
Admission: RE | Admit: 2024-06-30 | Discharge: 2024-06-30 | Disposition: A | Discharge status: Home / Self Care | Payer: Self-pay | Source: Ambulatory Visit | Attending: Cardiovascular Disease | Admitting: Cardiovascular Disease

## 2024-06-30 DIAGNOSIS — E782 Mixed hyperlipidemia: Secondary | ICD-10-CM | POA: Insufficient documentation

## 2024-06-30 DIAGNOSIS — F17200 Nicotine dependence, unspecified, uncomplicated: Secondary | ICD-10-CM | POA: Insufficient documentation

## 2024-07-01 NOTE — Progress Notes (Signed)
 Coronary calcium  score 06/30/2024: Coronary calcium  score of 0.  This is excellent, you just need continued primary prevention.
